# Patient Record
Sex: Female | Born: 1975 | Race: White | Hispanic: No | Marital: Married
Health system: Southern US, Community
[De-identification: ages and names within clinical notes are randomized; demographics above are authoritative.]

---

## 2014-08-06 ENCOUNTER — Ambulatory Visit: Payer: Self-pay | Admitting: Emergency Medicine

## 2014-08-06 LAB — WET PREP, GENITAL

## 2014-08-06 LAB — URINALYSIS, COMPLETE
Bilirubin,UR: NEGATIVE
Blood: NEGATIVE
Glucose,UR: NEGATIVE
KETONE: NEGATIVE
Nitrite: NEGATIVE
PH: 6.5 (ref 5.0–8.0)
PROTEIN: NEGATIVE
SPECIFIC GRAVITY: 1.015 (ref 1.000–1.030)

## 2014-08-06 LAB — GC/CHLAMYDIA PROBE AMP

## 2014-08-08 LAB — URINE CULTURE

## 2016-11-07 ENCOUNTER — Other Ambulatory Visit: Payer: Self-pay | Admitting: Obstetrics and Gynecology

## 2016-11-07 DIAGNOSIS — Z1231 Encounter for screening mammogram for malignant neoplasm of breast: Secondary | ICD-10-CM

## 2016-11-23 ENCOUNTER — Ambulatory Visit
Admission: RE | Admit: 2016-11-23 | Discharge: 2016-11-23 | Disposition: A | Discharge status: Home / Self Care | Payer: 59 | Source: Ambulatory Visit | Attending: Obstetrics and Gynecology | Admitting: Obstetrics and Gynecology

## 2016-11-23 DIAGNOSIS — Z1231 Encounter for screening mammogram for malignant neoplasm of breast: Secondary | ICD-10-CM | POA: Insufficient documentation

## 2017-06-29 ENCOUNTER — Other Ambulatory Visit: Payer: Self-pay | Admitting: Medical Oncology

## 2017-06-29 ENCOUNTER — Emergency Department: Payer: 59

## 2017-06-29 ENCOUNTER — Other Ambulatory Visit: Payer: Self-pay

## 2017-06-29 ENCOUNTER — Ambulatory Visit: Payer: 59

## 2017-06-29 ENCOUNTER — Encounter: Payer: Self-pay | Admitting: Radiology

## 2017-06-29 ENCOUNTER — Ambulatory Visit
Admission: RE | Admit: 2017-06-29 | Discharge: 2017-06-29 | Disposition: A | Discharge status: Home / Self Care | Payer: 59 | Source: Ambulatory Visit | Attending: Medical Oncology | Admitting: Medical Oncology

## 2017-06-29 ENCOUNTER — Emergency Department
Admission: EM | Admit: 2017-06-29 | Discharge: 2017-06-29 | Disposition: A | Discharge status: Home / Self Care | Payer: 59 | Attending: Emergency Medicine | Admitting: Emergency Medicine

## 2017-06-29 DIAGNOSIS — R1031 Right lower quadrant pain: Secondary | ICD-10-CM

## 2017-06-29 DIAGNOSIS — N83292 Other ovarian cyst, left side: Secondary | ICD-10-CM | POA: Diagnosis not present

## 2017-06-29 DIAGNOSIS — N83202 Unspecified ovarian cyst, left side: Secondary | ICD-10-CM

## 2017-06-29 LAB — URINALYSIS, COMPLETE (UACMP) WITH MICROSCOPIC
Bacteria, UA: NONE SEEN
Bilirubin Urine: NEGATIVE
Glucose, UA: NEGATIVE mg/dL
HGB URINE DIPSTICK: NEGATIVE
Ketones, ur: 20 mg/dL — AB
Leukocytes, UA: NEGATIVE
NITRITE: NEGATIVE
Protein, ur: NEGATIVE mg/dL
SPECIFIC GRAVITY, URINE: 1.018 (ref 1.005–1.030)
pH: 5 (ref 5.0–8.0)

## 2017-06-29 LAB — COMPREHENSIVE METABOLIC PANEL
ALBUMIN: 4.6 g/dL (ref 3.5–5.0)
ALK PHOS: 38 U/L (ref 38–126)
ALT: 21 U/L (ref 14–54)
AST: 19 U/L (ref 15–41)
Anion gap: 8 (ref 5–15)
BILIRUBIN TOTAL: 0.7 mg/dL (ref 0.3–1.2)
BUN: 11 mg/dL (ref 6–20)
CALCIUM: 9.1 mg/dL (ref 8.9–10.3)
CO2: 28 mmol/L (ref 22–32)
Chloride: 104 mmol/L (ref 101–111)
Creatinine, Ser: 0.69 mg/dL (ref 0.44–1.00)
GFR calc Af Amer: >60 mL/min (ref >60)
GFR calc non Af Amer: >60 mL/min (ref >60)
GLUCOSE: 94 mg/dL (ref 65–99)
Potassium: 3.5 mmol/L (ref 3.5–5.1)
Sodium: 140 mmol/L (ref 135–145)
TOTAL PROTEIN: 7.1 g/dL (ref 6.5–8.1)

## 2017-06-29 LAB — CBC
HEMATOCRIT: 35.3 % (ref 35.0–47.0)
Hemoglobin: 12.4 g/dL (ref 12.0–16.0)
MCH: 31.4 pg (ref 26.0–34.0)
MCHC: 35.1 g/dL (ref 32.0–36.0)
MCV: 89.3 fL (ref 80.0–100.0)
Platelets: 283 10*3/uL (ref 150–440)
RBC: 3.95 MIL/uL (ref 3.80–5.20)
RDW: 12.6 % (ref 11.5–14.5)
WBC: 8.1 10*3/uL (ref 3.6–11.0)

## 2017-06-29 LAB — LIPASE, BLOOD: Lipase: 31 U/L (ref 11–51)

## 2017-06-29 LAB — POCT PREGNANCY, URINE: Preg Test, Ur: NEGATIVE

## 2017-06-29 LAB — PREGNANCY, URINE: Preg Test, Ur: NEGATIVE

## 2017-06-29 MED ORDER — IOPAMIDOL (ISOVUE-300) INJECTION 61%
100.0000 mL | Freq: Once | INTRAVENOUS | Status: AC | PRN
  Administered 2017-06-29: 100 mL via INTRAVENOUS
  Filled 2017-06-29: qty 100

## 2017-06-29 MED ORDER — ONDANSETRON HCL 4 MG/2ML IJ SOLN
4.0000 mg | Freq: Once | INTRAMUSCULAR | Status: AC
  Administered 2017-06-29: 4 mg via INTRAVENOUS

## 2017-06-29 MED ORDER — ACETAMINOPHEN 500 MG PO TABS
1000.0000 mg | ORAL_TABLET | Freq: Once | ORAL | Status: AC
  Administered 2017-06-29: 1000 mg via ORAL
  Filled 2017-06-29: qty 2

## 2017-06-29 MED ORDER — ONDANSETRON HCL 4 MG/2ML IJ SOLN
INTRAMUSCULAR | Status: AC
  Filled 2017-06-29: qty 2

## 2017-06-29 NOTE — ED Provider Notes (Signed)
[facility] Emergency Department Provider Note  ____________________________________________  Time seen: Approximately 9:22 PM  I have reviewed the triage vital signs and the nursing notes.   HISTORY  Chief Complaint Abdominal Pain    HPI Cindy Ramos is a 42 y.o. female nonpregnant patient, status post cholecystectomy remotely, presenting with right lower quadrant pain for several weeks.  The patient reports that initially, she had intermittent pain in the right lower quadrant however over the past several weeks she is now having a constant sharp pain.  She has no associated nausea or vomiting, diarrhea, dysuria.  Her PMD has been evaluating her and she has had a pelvic ultrasound showing bilateral ovarian cysts.  The patient denies any change in her vaginal discharge or vaginal bleeding.  She has not tried anything for her pain.  History reviewed. No pertinent past medical history.  There are no active problems to display for this patient.   No past surgical history on file.    Allergies Flagyl [metronidazole]  No family history on file.  Social History Social History   Tobacco Use  . Smoking status: Not on file  Substance Use Topics  . Alcohol use: Not on file  . Drug use: Not on file    Review of Systems Constitutional: No fever/chills.  No lightheadedness or syncope. Eyes: No visual changes. ENT: No sore throat. No congestion or rhinorrhea. Cardiovascular: Denies chest pain. Denies palpitations. Respiratory: Denies shortness of breath.  No cough. Gastrointestinal: Positive right lower quadrant and right pelvic pain.  No nausea, no vomiting.  No diarrhea.  No constipation. Genitourinary: Negative for dysuria.  No urinary frequency.  No hematuria.  No change in vaginal discharge.  Known bilateral ovarian cyst. Musculoskeletal: Negative for back pain. Skin: Negative for rash. Neurological: Negative for headaches. No focal  numbness, tingling or weakness.     ____________________________________________   PHYSICAL EXAM:  VITAL SIGNS: ED Triage Vitals  Enc Vitals Group     BP 06/29/17 1716 140/80     Pulse Rate 06/29/17 1716 65     Resp 06/29/17 1716 18     Temp 06/29/17 1716 98.4 F (36.9 C)     Temp Source 06/29/17 1716 Oral     SpO2 06/29/17 1716 100 %     Weight --      Height --      Head Circumference --      Peak Flow --      Pain Score 06/29/17 1722 6     Pain Loc --      Pain Edu? --      Excl. in GC? --     Constitutional: Alert and oriented. Well appearing and in no acute distress. Answers questions appropriately. Eyes: Conjunctivae are normal.  EOMI. No scleral icterus. Head: Atraumatic. Nose: No congestion/rhinnorhea. Mouth/Throat: Mucous membranes are moist.  Neck: No stridor.  Supple.   Cardiovascular: Normal rate, regular rhythm. No murmurs, rubs or gallops.  Respiratory: Normal respiratory effort.  No accessory muscle use or retractions. Lungs CTAB.  No wheezes, rales or ronchi. Gastrointestinal: Soft, and nondistended.  Mild tenderness to palpation in the right lower quadrant.  Negative Murphy sign.  No guarding or rebound.  No peritoneal signs. Musculoskeletal: No LE edema. Neurologic:  A&Ox3.  Speech is clear.  Face and smile are symmetric.  EOMI.  Moves all extremities well. Skin:  Skin is warm, dry and intact. No rash noted. Psychiatric: Mood and affect are normal. Speech and behavior are  normal.  Normal judgement.  ____________________________________________   LABS (all labs ordered are listed, but only abnormal results are displayed)  Labs Reviewed  URINALYSIS, COMPLETE (UACMP) WITH MICROSCOPIC - Abnormal; Notable for the following components:      Result Value   Color, Urine YELLOW (*)    APPearance HAZY (*)    Ketones, ur 20 (*)    Squamous Epithelial / LPF 0-5 (*)    All other components within normal limits  LIPASE, BLOOD  COMPREHENSIVE METABOLIC PANEL   CBC  PREGNANCY, URINE  POC URINE PREG, ED  POCT PREGNANCY, URINE   ____________________________________________  EKG  Not indicated ____________________________________________  RADIOLOGY  Ct Abdomen Pelvis W Contrast  Result Date: 06/29/2017 CLINICAL DATA:  Abdominal distension. Right lower quadrant pain for several weeks. Patient had recent pelvic ultrasound at an outside institution demonstrating bilateral ovarian cysts. EXAM: CT ABDOMEN AND PELVIS WITH CONTRAST TECHNIQUE: Multidetector CT imaging of the abdomen and pelvis was performed using the standard protocol following bolus administration of intravenous contrast. CONTRAST:  ISOVUE-300 IOPAMIDOL (ISOVUE-300) INJECTION 61% COMPARISON:  None. FINDINGS: Lower chest: The lung bases are clear. Hepatobiliary: Enlarged liver spanning 21 cm cranial caudal. No focal lesion. Clips in the gallbladder fossa postcholecystectomy. No biliary dilatation. Pancreas: No ductal dilatation or inflammation. Spleen: Normal in size without focal abnormality. Adrenals/Urinary Tract: Normal adrenal glands. No hydronephrosis or perinephric edema. Homogeneous renal enhancement with symmetric excretion on delayed phase imaging. Bilateral renal cysts, largest on the left measuring 4.3 cm. Urinary bladder is physiologically distended without wall thickening. Stomach/Bowel: Normal appendix, for example image 59 series 2. No bowel wall thickening, inflammatory change or obstruction. Stomach is nondistended. Vascular/Lymphatic: No significant vascular findings are present. No enlarged abdominal or pelvic lymph nodes. Reproductive: Left ovarian cyst measures 2.8 cm. Right ovary is normal in size. Possible nabothian cysts in the cervix. Other: Minimal free fluid in the pelvis. No free air. No intra-abdominal abscess. Musculoskeletal: There are no acute or suspicious osseous abnormalities. IMPRESSION: 1. Normal appendix. 2. Left ovarian cyst measuring 2.8 cm. Per  technologist notes, this was recently evaluated with pelvic ultrasound at an outside institution. 3. Hepatomegaly. Electronically Signed   By: Rubye Oaks M.D.   On: 06/29/2017 22:10    ____________________________________________   PROCEDURES  Procedure(s) performed: None  Procedures  Critical Care performed: No ____________________________________________   INITIAL IMPRESSION / ASSESSMENT AND PLAN / ED COURSE  Pertinent labs & imaging results that were available during my care of the patient were reviewed by me and considered in my medical decision making (see chart for details).  42 y.o. female with several weeks of right lower quadrant abdominal pain without any other associated symptoms.  Overall, the patient is hemodynamically stable and afebrile.  She does have known ovarian cysts bilaterally, which may be causing her pain, but she is not giving a clinical history that would be suggestive of ovarian torsion.  Appendicitis is much less likely given the length of time the patient has been having symptoms.  Her bowel movements have been normal and of obstruction or diverticulitis is also less likely.  We will get a CT scan for 3D evaluation of the abdomen, and treat the patient for her symptoms that she has not tried any pain medications.   ----------------------------------------- 10:22 PM on 06/29/2017 -----------------------------------------  Patient is laboratory studies are reassuring.  She has a normal white blood cell count.  She does not have a urinary tract infection.  She is nonpregnant.  Her CT  scan does not show any acute process.  She does have a left ovarian cyst which measures 2.8 cm; this is not the area where she has tenderness on my examination.  At this time, the patient does not have any emergency condition that would require surgery or admission to the hospital.  I will plan to discharge her home with continued follow-up with her primary care physician.  I  discussed her results, follow-up plan and return precautions with the patient and her husband.  At this time the patient is safe for discharge. ____________________________________________  FINAL CLINICAL IMPRESSION(S) / ED DIAGNOSES  Final diagnoses:  Left ovarian cyst  Right lower quadrant abdominal pain         NEW MEDICATIONS STARTED DURING THIS VISIT:  New Prescriptions   No medications on file      Rockne MenghiniNorman, Anne-Caroline, MD 06/29/17 2222

## 2017-06-29 NOTE — Discharge Instructions (Signed)
You may continue to take Tylenol and Motrin for your pain.  Please return to the emergency department if you develop severe pain, lightheadedness or fainting, fever, or any other symptoms concerning to you.

## 2017-06-29 NOTE — ED Triage Notes (Signed)
Pt presents w/ c/o RLQ abdominal pain x 1-2 weeks per pt report. Pt states gradually worsening. Pt seen by PCP today and sent to have outpatient UC to r/o appendicitis. Wrong test ordered by PCP. Pt sent to ED for further evaluation for appendicitis. Pt has US w/i past week that showed bilateral ovarian cysts. Pt ambulatory, A & O x 4, in no acute distress.

## 2017-11-08 ENCOUNTER — Other Ambulatory Visit: Payer: Self-pay | Admitting: Obstetrics and Gynecology

## 2017-11-08 DIAGNOSIS — Z1231 Encounter for screening mammogram for malignant neoplasm of breast: Secondary | ICD-10-CM

## 2017-11-28 ENCOUNTER — Ambulatory Visit
Admission: RE | Admit: 2017-11-28 | Discharge: 2017-11-28 | Disposition: A | Discharge status: Home / Self Care | Payer: 59 | Source: Ambulatory Visit | Attending: Obstetrics and Gynecology | Admitting: Obstetrics and Gynecology

## 2017-11-28 DIAGNOSIS — Z1231 Encounter for screening mammogram for malignant neoplasm of breast: Secondary | ICD-10-CM | POA: Insufficient documentation

## 2017-11-30 ENCOUNTER — Other Ambulatory Visit: Payer: Self-pay | Admitting: Obstetrics and Gynecology

## 2017-11-30 DIAGNOSIS — R928 Other abnormal and inconclusive findings on diagnostic imaging of breast: Secondary | ICD-10-CM

## 2017-11-30 DIAGNOSIS — R921 Mammographic calcification found on diagnostic imaging of breast: Secondary | ICD-10-CM

## 2017-12-07 ENCOUNTER — Ambulatory Visit
Admission: RE | Admit: 2017-12-07 | Discharge: 2017-12-07 | Disposition: A | Discharge status: Home / Self Care | Payer: 59 | Source: Ambulatory Visit | Attending: Obstetrics and Gynecology | Admitting: Obstetrics and Gynecology

## 2017-12-07 DIAGNOSIS — R921 Mammographic calcification found on diagnostic imaging of breast: Secondary | ICD-10-CM | POA: Diagnosis present

## 2017-12-07 DIAGNOSIS — R928 Other abnormal and inconclusive findings on diagnostic imaging of breast: Secondary | ICD-10-CM | POA: Diagnosis present

## 2017-12-13 ENCOUNTER — Other Ambulatory Visit: Payer: Self-pay | Admitting: Obstetrics and Gynecology

## 2017-12-13 DIAGNOSIS — R921 Mammographic calcification found on diagnostic imaging of breast: Secondary | ICD-10-CM

## 2017-12-13 DIAGNOSIS — R928 Other abnormal and inconclusive findings on diagnostic imaging of breast: Secondary | ICD-10-CM

## 2017-12-24 ENCOUNTER — Ambulatory Visit
Admission: RE | Admit: 2017-12-24 | Discharge: 2017-12-24 | Disposition: A | Discharge status: Home / Self Care | Payer: 59 | Source: Ambulatory Visit | Attending: Obstetrics and Gynecology | Admitting: Obstetrics and Gynecology

## 2017-12-24 DIAGNOSIS — R921 Mammographic calcification found on diagnostic imaging of breast: Secondary | ICD-10-CM | POA: Insufficient documentation

## 2017-12-24 DIAGNOSIS — R928 Other abnormal and inconclusive findings on diagnostic imaging of breast: Secondary | ICD-10-CM | POA: Diagnosis present

## 2017-12-24 HISTORY — PX: BREAST BIOPSY: SHX20

## 2017-12-25 LAB — SURGICAL PATHOLOGY

## 2019-01-14 ENCOUNTER — Encounter: Payer: Self-pay | Admitting: Occupational Therapy

## 2019-01-14 ENCOUNTER — Other Ambulatory Visit: Payer: Self-pay

## 2019-01-14 ENCOUNTER — Ambulatory Visit: Payer: 59 | Attending: Orthopedic Surgery | Admitting: Occupational Therapy

## 2019-01-14 DIAGNOSIS — G5621 Lesion of ulnar nerve, right upper limb: Secondary | ICD-10-CM | POA: Insufficient documentation

## 2019-01-14 DIAGNOSIS — M6281 Muscle weakness (generalized): Secondary | ICD-10-CM | POA: Diagnosis present

## 2019-01-14 DIAGNOSIS — M79601 Pain in right arm: Secondary | ICD-10-CM | POA: Diagnosis present

## 2019-01-14 DIAGNOSIS — M25621 Stiffness of right elbow, not elsewhere classified: Secondary | ICD-10-CM | POA: Insufficient documentation

## 2019-01-14 NOTE — Therapy (Signed)
[facility] [address], [place], [address] Phone: [phone]   Fax:  [phone]  Occupational Therapy Evaluation  Patient Details  Name: Cindy Ramos MRN: [id number] Date of Birth: 07/12/1975 Referring Provider (OT): Reche Dixon   Encounter Date: 01/14/2019  OT End of Session - 01/14/19 1129    Visit Number  1    Number of Visits  10    Date for OT Re-Evaluation  02/18/19    OT Start Time  0950    OT Stop Time  1100    OT Time Calculation (min)  70 min    Activity Tolerance  Patient tolerated treatment well    Behavior During Therapy  [facility] for tasks assessed/performed       History reviewed. No pertinent past medical history.  Past Surgical History:  Procedure Laterality Date  . BREAST BIOPSY Left 12/24/2017   left breast stereo  x clip path pending    There were no vitals filed for this visit.  Subjective Assessment - 01/14/19 1113    Subjective   Since my fall my elbow just did not get better- and then after the MRI my shoulder and neck started bothering me - pain got worse and numbness in my pinkie and ring finger- pain is between 4-10/10    Pertinent History  Pt fell down few steps July 15th , was seen by ortho on 8/18  - had MRI that showed contusion of ulnar N , had 2 xray at urgent care, and ortho - showing no xray - pt refer to OT    Patient Stated Goals  I want thepain better in my R arm so I can use it to do my work, take care of the new litter of puppies, gardening , baking , and house work    Currently in Pain?  Yes    Pain Score  4    9-10/10 with use or ROM   Pain Location  Elbow   R shoulder and upper traps   Pain Orientation  Right    Pain Descriptors / Indicators  Aching;Numbness;Pins and needles;Throbbing;Tender    Pain Type  Acute pain    Pain Onset  More than a month ago    Pain Frequency  Constant    Aggravating Factors   using it , ROM , bumping , pulling ,  lifting        [facility] OT Assessment - 01/14/19 0001      Assessment   Medical Diagnosis  R ulnar N contusion    Referring Provider (OT)  Reche Dixon    Onset Date/Surgical Date  11/27/18    Hand Dominance  Right    Next MD Visit  --   Sept 10th      Home  Environment   Lives With  Family      Prior Function   Vocation  Full time employment    Leisure  Work in Psychologist, educational, gardening , baking , homeschool, litter of puppies       Edema   Edema  elbow R 27.2 cm , L 26 cm       AROM   Right Elbow Flexion  135   pain increase to 9/10 end   Right Elbow Extension  --   pain increase to 9/10 end    Right Wrist Extension  --   pull volar at end range   Right Wrist Flexion  --   pull  at end range   Right Wrist Radial Deviation  20 Degrees    Right Wrist Ulnar Deviation  30 Degrees      Right Hand AROM   R Thumb Opposition to Index  --   Opposition to [facility]     Contrast to elbow prior to review of HEP     Contrast to elbow 2-3 x day  AROM for elbow pain free range of motion - stay out of flexion more than 90 degrees  8 reps  Shoulder flexion - reaching over head pain free - 8 reps Snow angles - shoulder ABD 8 reps pain free  Back against wall   5 x day scapula squeezes   AROM for wrist in all planes pain free 2x day during elbow and shoulder ROM   2 x day - husband massage R upper traps  Cervical lateral flexion on L , 8 hold 5 sec Scalenes stretch on the L 8 hold 5 sec   Modifications of positioning of R arm - avoid cradling, flexion more than 90 degrees elbow during  - sitting , walking and sleeping  Light compression sleeve - tubigrip F to wear to tolerance                OT Education - 01/14/19 1128    Education Details  findings , homeprogram, modifications of positioning of R UE    Person(s) Educated  Patient    Methods  Explanation;Demonstration;Tactile cues;Verbal cues;Handout    Comprehension  Verbalized understanding;Returned demonstration        OT Short Term Goals - 01/14/19 1140      OT SHORT TERM GOAL #1   Title  Pt to be independent in HEP to decrease pain on PREE by more than 10 points    Baseline  no knowledge on HEP    Time  2    Period  Weeks    Status  New    Target Date  01/28/19      OT SHORT TERM GOAL #2   Title  AROM in R cervical , shoulder and wrist in all planes with out increase insymptoms    Baseline  decrease shoulder , cervical ROM with pull or pain - and wrist flexion , ext pain or pull end range    Time  3    Period  Weeks    Status  New    Target Date  02/04/19        OT Long Term Goals - 01/14/19 1142      OT LONG TERM GOAL #1   Title  R elbow AROM improve to [facility] with out increase in symptoms    Baseline  elbow flexion 135 and ext -12 to -25 with pain increase to 9/10    Time  5    Period  Weeks    Status  New    Target Date  02/18/19      OT LONG TERM GOAL #2   Title  Pain on PREE improve with more than 25 points    Baseline  pain on PREE at eval 44/50    Time  5    Period  Weeks    Status  New    Target Date  02/18/19      OT LONG TERM GOAL #3   Title  Function score on PREE improve wiht more than 20 points    Baseline  at eval PREE function score 50/50    Time  5  Period  Weeks    Status  New    Target Date  02/18/19            Plan - 01/14/19 1130    Clinical Impression Statement  Pt is about 7 wks out from a fall resulting in  R ulnar N contusion - xray showed no fracture - pt show increase pain at R elbow, pect and upper traps , numbness in 5th and 4th digits,  decrease AROM in R cervical, shoulder ,elbow - pain increase from 4-10/10 with AROM - pt ed on nerve healing, modifications of positioning of R UE when walking , sitting and sleeping to decrease compression on ulnar N - did provided pt with light compression sleeve to wear to tolerance - all imiting her functional use of R dominant hand and arm in ADL's and IADL's    OT Occupational Profile and History   Problem Focused Assessment - Including review of records relating to presenting problem    Occupational performance deficits (Please refer to evaluation for details):  ADL's;IADL's;Rest and Sleep;Work;Play;Leisure;Social Participation    Body Structure / Function / Physical Skills  ADL;Flexibility;ROM;UE functional use;Edema;Pain;Strength;IADL    Rehab Potential  Fair    Clinical Decision Making  Several treatment options, min-mod task modification necessary    Comorbidities Affecting Occupational Performance:  None    Modification or Assistance to Complete Evaluation   No modification of tasks or assist necessary to complete eval    OT Frequency  2x / week    OT Duration  --   5 wks   OT Treatment/Interventions  Self-care/ADL training;Iontophoresis;Therapeutic exercise;Splinting;Patient/family education;Contrast Bath;Paraffin;Ultrasound;Manual Therapy    Plan  assess progress with HEP and ionto with dexamethazone    OT Home Exercise Plan  see pt instruction    Consulted and Agree with Plan of Care  Patient       Patient will benefit from skilled therapeutic intervention in order to improve the following deficits and impairments:   Body Structure / Function / Physical Skills: ADL, Flexibility, ROM, UE functional use, Edema, Pain, Strength, IADL       Visit Diagnosis: Pain in right arm - Plan: Ot plan of care cert/re-cert  Stiffness of right elbow, not elsewhere classified - Plan: Ot plan of care cert/re-cert  Muscle weakness (generalized) - Plan: Ot plan of care cert/re-cert  Ulnar nerve compression, right - Plan: Ot plan of care cert/re-cert    Problem List There are no active problems to display for this patient.   Oletta Cohn OTR/L,CLT 01/14/2019, 11:47 AM  [facility] [address] [place], [address] Phone: [phone]   Fax:  [phone]  Name: CHARDONAY LONGFELLOW MRN:  [id number] Date of Birth: [birth year]

## 2019-01-14 NOTE — Patient Instructions (Signed)
Contrast to elbow 2-3 x day  AROM for elbow pain free range of motion - stay out of flexion more than 90 degrees  8 reps  Shoulder flexion - reaching over head pain free - 8 reps Snow angles - shoulder ABD 8 reps pain free  Back against wall   5 x day scapula squeezes   AROM for wrist in all planes pain free 2x day during elbow and shoulder ROM   2 x day - husband massage R upper traps  Cervical lateral flexion on L , 8 hold 5 sec Scalenes stretch on the L 8 hold 5 sec   Modifications of positioning of R arm - avoid cradling, flexion more than 90 degrees elbow during  - sitting , walking and sleeping  Light compression sleeve - tubigrip F to wear to tolerance

## 2019-01-15 ENCOUNTER — Ambulatory Visit: Payer: 59 | Admitting: Occupational Therapy

## 2019-01-15 DIAGNOSIS — M79601 Pain in right arm: Secondary | ICD-10-CM

## 2019-01-15 DIAGNOSIS — G5621 Lesion of ulnar nerve, right upper limb: Secondary | ICD-10-CM

## 2019-01-15 DIAGNOSIS — M6281 Muscle weakness (generalized): Secondary | ICD-10-CM

## 2019-01-15 NOTE — Therapy (Signed)
[facility] [address], [place], [address] Phone: [phone]   Fax:  [phone]  Occupational Therapy Treatment  Patient Details  Name: Cindy Ramos MRN: [id number] Date of Birth: 02-15-1976 Referring Provider (OT): Reche Dixon   Encounter Date: 01/15/2019  OT End of Session - 01/15/19 1633    Visit Number  2    Number of Visits  10    Date for OT Re-Evaluation  02/18/19       No past medical history on file.  Past Surgical History:  Procedure Laterality Date  . BREAST BIOPSY Left 12/24/2017   left breast stereo  x clip path pending    There were no vitals filed for this visit.  Subjective Assessment - 01/15/19 1631    Subjective   It was rough last night - after I seen you yesterday - but I tried to keep my elbow more straight - and not cradling it - the compression sleeve feels good-    Pertinent History  Pt fell down few steps July 15th , was seen by ortho on 8/18  - had MRI that showed contusion of ulnar N , had 2 xray at urgent care, and ortho - showing no xray - pt refer to OT    Patient Stated Goals  I want thepain better in my R arm so I can use it to do my work, take care of the new litter of puppies, gardening , baking , and house work    Currently in Pain?  Yes    Pain Score  7     Pain Location  Arm    Pain Orientation  Right    Pain Descriptors / Indicators  Aching    Pain Type  Acute pain    Pain Onset  More than a month ago         Pt come in this date with pain 8/10  This date ionto initiate with dexamethazone - med patch but only could tolerate 0.5 intensity - done this date only for 18 min   skin check done prior and end - no skin issues - but pain  Pt to focus on pain control this weekend - decrease pain and maintain ROM - can do shoulder flexion in supine or sliding on wall  And wrist AROM in all planes  And NO  pulling , lifting or pushing  Can get elbow  pad for protection- padding during day over cubital tunnel and night time on volar elbow    OT Education - 01/15/19 1632    Education Details  findings , homeprogram, modifications of positioning of R UE and to look into soft elbow splint    Person(s) Educated  Patient    Methods  Explanation;Demonstration;Tactile cues;Verbal cues;Handout    Comprehension  Verbalized understanding;Returned demonstration       OT Short Term Goals - 01/14/19 1140      OT SHORT TERM GOAL #1   Title  Pt to be independent in HEP to decrease pain on PREE by more than 10 points    Baseline  no knowledge on HEP    Time  2    Period  Weeks    Status  New    Target Date  01/28/19      OT SHORT TERM GOAL #2   Title  AROM in R cervical , shoulder and wrist in all planes with out increase insymptoms    Baseline  decrease shoulder , cervical ROM with pull or pain - and wrist flexion , ext pain or pull end range    Time  3    Period  Weeks    Status  New    Target Date  02/04/19        OT Long Term Goals - 01/14/19 1142      OT LONG TERM GOAL #1   Title  R elbow AROM improve to [facility] with out increase in symptoms    Baseline  elbow flexion 135 and ext -12 to -25 with pain increase to 9/10    Time  5    Period  Weeks    Status  New    Target Date  02/18/19      OT LONG TERM GOAL #2   Title  Pain on PREE improve with more than 25 points    Baseline  pain on PREE at eval 44/50    Time  5    Period  Weeks    Status  New    Target Date  02/18/19      OT LONG TERM GOAL #3   Title  Function score on PREE improve wiht more than 20 points    Baseline  at eval PREE function score 50/50    Time  5    Period  Weeks    Status  New    Target Date  02/18/19            Plan - 01/15/19 1633    Clinical Impression Statement  Pt is about 7 wks out from a fall resulting in  R ulnar N contusion - xray showed no fracture - pt show increase pain at R elbow, pect and upper traps , numbness in 5th and 4th  digits,  decrease AROM in R cervical, shoulder ,elbow - pain increase from 4-10/10 with AROM - pt ed on nerve healing, modifications of positioning of R UE when walking , sitting and sleeping to decrease compression on ulnar N - did provided pt with light compression sleeve to wear to tolerance - all imiting her functional use of R dominant hand and arm in ADL's and IADL's    OT Occupational Profile and History  Problem Focused Assessment - Including review of records relating to presenting problem    Occupational performance deficits (Please refer to evaluation for details):  ADL's;IADL's;Rest and Sleep;Work;Play;Leisure;Social Participation    Body Structure / Function / Physical Skills  ADL;Flexibility;ROM;UE functional use;Edema;Pain;Strength;IADL    Rehab Potential  Fair    Clinical Decision Making  Several treatment options, min-mod task modification necessary    Comorbidities Affecting Occupational Performance:  None    Modification or Assistance to Complete Evaluation   No modification of tasks or assist necessary to complete eval    OT Frequency  2x / week    OT Duration  --   5 wks   OT Treatment/Interventions  Self-care/ADL training;Iontophoresis;Therapeutic exercise;Splinting;Patient/family education;Contrast Bath;Paraffin;Ultrasound;Manual Therapy    Plan  assess progress with HEP and ionto with dexamethazone    OT Home Exercise Plan  see pt instruction    Consulted and Agree with Plan of Care  Patient       Patient will benefit from skilled therapeutic intervention in order to improve the following deficits and impairments:   Body Structure / Function / Physical Skills: ADL, Flexibility, ROM, UE functional use, Edema, Pain, Strength, IADL       Visit Diagnosis: Muscle weakness (generalized)  Ulnar  nerve compression, right  Pain in right arm    Problem List There are no active problems to display for this patient.   Oletta Cohn OTR/L,CLT 01/15/2019, 5:56  PM  [facility] [address], [place], [address] Phone: [phone]   Fax:  [phone]  Name: Cindy Ramos MRN: [id number] Date of Birth: Apr 28, 1976

## 2019-01-21 ENCOUNTER — Other Ambulatory Visit: Payer: Self-pay

## 2019-01-21 ENCOUNTER — Ambulatory Visit: Payer: 59 | Admitting: Occupational Therapy

## 2019-01-21 DIAGNOSIS — G5621 Lesion of ulnar nerve, right upper limb: Secondary | ICD-10-CM

## 2019-01-21 DIAGNOSIS — M25621 Stiffness of right elbow, not elsewhere classified: Secondary | ICD-10-CM

## 2019-01-21 DIAGNOSIS — M79601 Pain in right arm: Secondary | ICD-10-CM

## 2019-01-21 DIAGNOSIS — M6281 Muscle weakness (generalized): Secondary | ICD-10-CM

## 2019-01-21 NOTE — Patient Instructions (Signed)
Same HEP- decrease pain priority - contrast , elbow pad , positioning and pain free AROM

## 2019-01-21 NOTE — Therapy (Signed)
[place] [facility] [address] [place], [address] Phone: [phone]   Fax:  [phone]  Occupational Therapy Treatment  Patient Details  Name: Cindy Ramos MRN: [id number] Date of Birth: 11-07-1975 Referring Provider (OT): Dedra Skeens   Encounter Date: 01/21/2019  OT End of Session - 01/21/19 1018    Visit Number  3    Number of Visits  10    Date for OT Re-Evaluation  02/18/19    OT Start Time  0900    OT Stop Time  0952    OT Time Calculation (min)  52 min    Activity Tolerance  Patient tolerated treatment well    Behavior During Therapy  [facility] for tasks assessed/performed       No past medical history on file.  Past Surgical History:  Procedure Laterality Date  . BREAST BIOPSY Left 12/24/2017   left breast stereo  x clip path pending    There were no vitals filed for this visit.  Subjective Assessment - 01/21/19 1005    Subjective   I had rough night after I seen you last time but last 2-3 nights actually feeling better- pain about 2/10 - and now about 4/10  - trying to keep hand in pocket and not carry my hand or propping up - did get this elbow sleeve like you told me Sat or Sun - and it is helpoing    Pertinent History  Pt fell down few steps July 15th , was seen by ortho on 8/18  - had MRI that showed contusion of ulnar N , had 2 xray at urgent care, and ortho - showing no xray - pt refer to OT    Patient Stated Goals  I want thepain better in my R arm so I can use it to do my work, take care of the new litter of puppies, gardening , baking , and house work    Currently in Pain?  Yes    Pain Score  4     Pain Location  Elbow    Pain Orientation  Right    Pain Descriptors / Indicators  Aching;Tender    Pain Type  Acute pain    Pain Onset  More than a month ago    Pain Frequency  Constant       Assess pt 's elbow pad - over weekend she got it and wearing at night time pad on  Volar  elbow and during day over cubital tunnel - she had it more radial - review fit again -and she can snip the distal band if to tight   Review with pt shoulder pendulum and flexion , snow angels for ABD - with elbow extention and flexion secondary gain -  Do not have to push elbow flexion and ext separate  Done sup /pro, flexion , ext, RD, UD  10 reps pain free   Pins and needles better in 5th and 4th  Pt report improvement in able to support on belt of pants or pocket - not propping up and sleeping better   ADD of 5th 4/5     Heat for 5 min to R upper traps - done graston nr 4 tool sweeping - and lateral flexion stretch  And scapula squeezes   [employer] nr 2 done on volar and dorsal forearm - but no tightness          Done at end of session to decrease pain -  with doing some AROM and manual    OT Treatments/Exercises (OP) - 01/21/19 0001      RUE Contrast Bath   Time  9 minutes    Comments  --   End of session to decrease pain             OT Education - 01/21/19 1008    Education Details  HEP review and POC    Person(s) Educated  Patient    Methods  Explanation;Demonstration;Tactile cues;Verbal cues;Handout    Comprehension  Verbalized understanding;Returned demonstration       OT Short Term Goals - 01/14/19 1140      OT SHORT TERM GOAL #1   Title  Pt to be independent in HEP to decrease pain on PREE by more than 10 points    Baseline  no knowledge on HEP    Time  2    Period  Weeks    Status  New    Target Date  01/28/19      OT SHORT TERM GOAL #2   Title  AROM in R cervical , shoulder and wrist in all planes with out increase insymptoms    Baseline  decrease shoulder , cervical ROM with pull or pain - and wrist flexion , ext pain or pull end range    Time  3    Period  Weeks    Status  New    Target Date  02/04/19        OT Long Term Goals - 01/14/19 1142      OT LONG TERM GOAL #1   Title  [facility] out [facility] in  symptoms    Baseline  elbow flexion 135 and ext -12 to -25 with pain increase to 9/10    Time  5    Period  Weeks    Status  New    Target Date  02/18/19      OT LONG TERM GOAL #2   Title  Pain on PREE improve with more than 25 points    Baseline  pain on PREE at eval 44/50    Time  5    Period  Weeks    Status  New    Target Date  02/18/19      OT LONG TERM GOAL #3   Title  Function score on PREE improve wiht more than 20 points    Baseline  at eval PREE function score 50/50    Time  5    Period  Weeks    Status  New    Target Date  02/18/19            Plan - 01/21/19 1018    Clinical Impression Statement  Pt is 8 wks out from R ulnar N contusion from fall - pt's pain decrease since last week to 2/10 night - do increase during day -but with her elbow pad and contrast can decrese it - pt ed again on positioning and priority this week is decrease pain not ROM or using it    OT Occupational Profile and History  Problem Focused Assessment - Including review of records relating to presenting problem    Occupational performance deficits (Please refer to evaluation for details):  ADL's;IADL's;Rest and Sleep;Work;Play;Leisure;Social Participation    Body Structure / Function / Physical Skills  ADL;Flexibility;ROM;UE functional use;Edema;Pain;Strength;IADL    Rehab Potential  Fair    Clinical Decision Making  Several treatment options, min-mod task modification  necessary    Comorbidities Affecting Occupational Performance:  None    Modification or Assistance to Complete Evaluation   No modification of tasks or assist necessary to complete eval    OT Frequency  2x / week    OT Duration  4 weeks    OT Treatment/Interventions  Self-care/ADL training;Iontophoresis;Therapeutic exercise;Splinting;Patient/family education;Contrast Bath;Paraffin;Ultrasound;Manual Therapy    Plan  assess progress with HEP and ionto with dexamethazone    OT Home Exercise Plan  see pt instruction     Consulted and Agree with Plan of Care  Patient       Patient will benefit from skilled therapeutic intervention in order to improve the following deficits and impairments:   Body Structure / Function / Physical Skills: ADL, Flexibility, ROM, UE functional use, Edema, Pain, Strength, IADL       Visit Diagnosis: Ulnar nerve compression, right  Pain in right arm  Stiffness of right elbow, not elsewhere classified  Muscle weakness (generalized)    Problem List There are no active problems to display for this patient.   Rosalyn Gess OTR/L,CLT 01/21/2019, 10:22 AM  [place] [facility] [address], [place], [address] Phone: [phone]   Fax:  [phone]  Name: Cindy Ramos MRN: [id number] Date of Birth: 05-10-1976

## 2019-01-24 ENCOUNTER — Ambulatory Visit: Payer: 59 | Admitting: Occupational Therapy

## 2019-01-24 ENCOUNTER — Other Ambulatory Visit: Payer: Self-pay

## 2019-01-24 DIAGNOSIS — M79601 Pain in right arm: Secondary | ICD-10-CM | POA: Diagnosis not present

## 2019-01-24 DIAGNOSIS — M6281 Muscle weakness (generalized): Secondary | ICD-10-CM

## 2019-01-24 DIAGNOSIS — G5621 Lesion of ulnar nerve, right upper limb: Secondary | ICD-10-CM

## 2019-01-24 DIAGNOSIS — M25621 Stiffness of right elbow, not elsewhere classified: Secondary | ICD-10-CM

## 2019-01-24 NOTE — Therapy (Signed)
[facility] [address] [place], [address] Phone: [phone]   Fax:  [phone]  Occupational Therapy Treatment  Patient Details  Name: Cindy Ramos MRN: [id number] Date of Birth: 15-Dec-1975 Referring Provider (OT): Dedra Skeens   Encounter Date: 01/24/2019  OT End of Session - 01/24/19 0937    Visit Number  4    Number of Visits  10    Date for OT Re-Evaluation  02/18/19    OT Start Time  0830    OT Stop Time  0926    OT Time Calculation (min)  56 min    Activity Tolerance  Patient tolerated treatment well    Behavior During Therapy  [facility] for tasks assessed/performed       No past medical history on file.  Past Surgical History:  Procedure Laterality Date  . BREAST BIOPSY Left 12/24/2017   left breast stereo  x clip path pending    There were no vitals filed for this visit.  Subjective Assessment - 01/24/19 0934    Subjective   I did see Dedra Skeens - follow up in 3 wks again - doing better- I can tell if it getting better -but when I use it - pain increase again - like washing my hair and cutting peppers    Pertinent History  Pt fell down few steps July 15th , was seen by ortho on 8/18  - had MRI that showed contusion of ulnar N , had 2 xray at urgent care, and ortho - showing no xray - pt refer to OT    Patient Stated Goals  I want thepain better in my R arm so I can use it to do my work, take care of the new litter of puppies, gardening , baking , and house work    Currently in Pain?  Yes    Pain Score  4     Pain Location  Arm    Pain Orientation  Right    Pain Descriptors / Indicators  Aching;Tender;Numbness    Pain Type  Acute pain    Pain Onset  More than a month ago    Pain Frequency  Constant         [facility] OT Assessment - 01/24/19 0001      AROM   Right Elbow Flexion  --   110 to 120   Right Elbow Extension  --   -18 pain free- can do extention but increase pain          Assess pt 's elbow pad - she did snip  the distal band that was to  tight  And wearing night time over anterior elbow and daytime - over cubital tunnel    shoulder pendulum and flexion , snow angels for ABD - with elbow extention and flexion secondary gain -  Do not have to push elbow flexion and ext separate  Done sup /pro, flexion , ext, RD, UD  10 reps pain free   Pins and needles better in 5th and 4th    Pt report improvement in able to support on belt of pants or pocket - not propping up and sleeping better   ADD of 5th digit 4/5     Heat for 5 min to R upper traps - done graston nr 4 tool sweeping - and lateral flexion stretch  And scapula squeezes  US done at end of session   OT Treatments/Exercises (OP) - 01/24/19 0001      Ultrasound   Ultrasound Location  posterior and ant to cubital tunnel     Ultrasound Parameters  3.3MHZ, 20% , 0.8 intensity     Ultrasound Goals  Edema;Pain      RUE Contrast Bath   Time  9 minutes    Comments  --   after AROM to decrease pain             OT Education - 01/24/19 0937    Education Details  HEP review and ed on keep pain undre 5/10 this weekend    Person(s) Educated  Patient    Methods  Explanation;Demonstration;Tactile cues;Verbal cues;Handout    Comprehension  Verbalized understanding;Returned demonstration       OT Short Term Goals - 01/14/19 1140      OT SHORT TERM GOAL #1   Title  Pt to be independent in HEP to decrease pain on PREE by more than 10 points    Baseline  no knowledge on HEP    Time  2    Period  Weeks    Status  New    Target Date  01/28/19      OT SHORT TERM GOAL #2   Title  AROM in R cervical , shoulder and wrist in all planes with out increase insymptoms    Baseline  decrease shoulder , cervical ROM with pull or pain - and wrist flexion , ext pain or pull end range    Time  3    Period  Weeks    Status  New    Target Date  02/04/19         OT Long Term Goals - 01/14/19 1142      OT LONG TERM GOAL #1   Title  R elbow AROM improve to [facility] with out increase in symptoms    Baseline  elbow flexion 135 and ext -12 to -25 with pain increase to 9/10    Time  5    Period  Weeks    Status  New    Target Date  02/18/19      OT LONG TERM GOAL #2   Title  Pain on PREE improve with more than 25 points    Baseline  pain on PREE at eval 44/50    Time  5    Period  Weeks    Status  New    Target Date  02/18/19      OT LONG TERM GOAL #3   Title  Function score on PREE improve wiht more than 20 points    Baseline  at eval PREE function score 50/50    Time  5    Period  Weeks    Status  New    Target Date  02/18/19            Plan - 01/24/19 6967    Clinical Impression Statement  Pt is 8 1/2 wls out from R ulnar N contusion - pt very active lady -pain is decreasing - but reinforce to rest elbow this weekend while husband can help - and do not have pain go more than 4-5/10 during day    OT Occupational Profile and History  Problem Focused Assessment - Including review of records relating to presenting problem    Occupational performance deficits (Please refer to evaluation for details):  ADL's;IADL's;Rest and Sleep;Work;Play;Leisure;Social Participation    Body  Structure / Function / Physical Skills  ADL;Flexibility;ROM;UE functional use;Edema;Pain;Strength;IADL    Rehab Potential  Fair    Clinical Decision Making  Several treatment options, min-mod task modification necessary    Comorbidities Affecting Occupational Performance:  None    Modification or Assistance to Complete Evaluation   No modification of tasks or assist necessary to complete eval    OT Frequency  2x / week    OT Duration  4 weeks    OT Treatment/Interventions  Self-care/ADL training;Iontophoresis;Therapeutic exercise;Splinting;Patient/family education;Contrast Bath;Paraffin;Ultrasound;Manual Therapy    Plan  assess progress with HEP and ionto with  dexamethazone    OT Home Exercise Plan  see pt instruction    Consulted and Agree with Plan of Care  Patient       Patient will benefit from skilled therapeutic intervention in order to improve the following deficits and impairments:   Body Structure / Function / Physical Skills: ADL, Flexibility, ROM, UE functional use, Edema, Pain, Strength, IADL       Visit Diagnosis: Ulnar nerve compression, right  Pain in right arm  Stiffness of right elbow, not elsewhere classified  Muscle weakness (generalized)    Problem List There are no active problems to display for this patient.   Oletta CohnuPreez, Najmo Pardue OTR/L,CLT 01/24/2019, 9:40 AM  [place] [facility] [address] [place], [place], [address] Phone: [phone]   Fax:  [phone]  Name: Bennye AlmJennifer F Arnold-Ta MRN: [id number] Date of Birth: 06-05-1975

## 2019-01-24 NOTE — Patient Instructions (Signed)
Same - but do not let pain increase more than 5/10 during day  Modify act, rest , elbow pad, contrast - and what ever other alternative ointments she use

## 2019-01-28 ENCOUNTER — Ambulatory Visit: Payer: 59 | Admitting: Occupational Therapy

## 2019-01-28 ENCOUNTER — Other Ambulatory Visit: Payer: Self-pay

## 2019-01-28 DIAGNOSIS — M25621 Stiffness of right elbow, not elsewhere classified: Secondary | ICD-10-CM

## 2019-01-28 DIAGNOSIS — M79601 Pain in right arm: Secondary | ICD-10-CM

## 2019-01-28 DIAGNOSIS — G5621 Lesion of ulnar nerve, right upper limb: Secondary | ICD-10-CM

## 2019-01-28 DIAGNOSIS — M6281 Muscle weakness (generalized): Secondary | ICD-10-CM

## 2019-01-28 NOTE — Therapy (Signed)
[facility] [facility] [address] [place], [place], [address] Phone: [phone]   Fax:  [phone]  Occupational Therapy Treatment  Patient Details  Name: Cindy AlmJennifer F Ramos MRN: [id number] Date of Birth: [birth year] Referring Provider (OT): Dedra Skeensodd Mundy   Encounter Date: 01/28/2019  OT End of Session - 01/28/19 1646    Number of Visits  10    Date for OT Re-Evaluation  02/18/19    OT Start Time  1415    OT Stop Time  1514    OT Time Calculation (min)  59 min    Activity Tolerance  Patient tolerated treatment well    Behavior During Therapy  [facility]       No past medical history on file.  Past Surgical History:  Procedure Laterality Date  . BREAST BIOPSY Left 12/24/2017   left breast stereo  x clip path pending    There were no vitals filed for this visit.  Subjective Assessment - 01/28/19 1639    Subjective   I really worked hard this weekend on not doing to much - felt like at times if doing nothing it got better , but then like washing my hair, writing it makes it hurt again - and today was not good day - did not do any heat or ice yet - was with son at oral surgeon and [place]ing [place]    Pertinent History  Pt fell down few steps July 15th , was seen by ortho on 8/18  - had MRI that showed contusion of ulnar N , had 2 xray at urgent care, and ortho - showing no xray - pt refer to OT    Patient Stated Goals  I want thepain better in my R arm so I can use it to do my work, take care of the new litter of puppies, gardening , baking , and house work    Currently in Pain?  Yes    Pain Score  7     Pain Location  Elbow    Pain Orientation  Right    Pain Descriptors / Indicators  Aching;Numbness;Tender    Pain Type  Acute pain    Pain Onset  More than a month ago         Pt cont to wear elbow pad - she did snip  the distal band that was to  tight  And wearing  night time over anterior elbow and daytime - over cubital tunnel    shoulder pendulum and flexion , snow angels for ABD - with elbow extention and flexion secondary gain - full extention  Elbow flexion WFL - but increase numbness in 5th digit  Do not have to push elbow flexion and ext separate Done sup /pro, RD, UD  AROM and can cont  10 reps pain free   Pins and needles  5th and 4th not constant anymore  But pt arrive this date with increase pain at lateral epicondyle - tenderness and pain with middle finger and wrist extention resistance  After contrast - done  Criss cross massage to lateral epicondyle AROM stretch to elbow extensors - with elbow to side 5-8 reps  Ice massage to lateral epicondyle And can do at home   Fitted with wrist splint to rest extensors -and mostly when up and about    Pt report improvement in able to support on belt of pants or pocket - not propping  up and sleeping better             OT Treatments/Exercises (OP) - 01/28/19 0001      Cryotherapy   Number Minutes Cryotherapy  2 Minutes    Cryotherapy Location  --   lateral epicondyle      RUE Contrast Bath   Time  9 minutes    Comments  --    to decrease pain after AROM             OT Education - 01/28/19 1643    Education Details  Review HEP and lateral epicondyle HEP    Person(s) Educated  Patient    Methods  Explanation;Demonstration;Tactile cues;Verbal cues;Handout    Comprehension  Verbalized understanding;Returned demonstration       OT Short Term Goals - 01/14/19 1140      OT SHORT TERM GOAL #1   Title  Pt to be independent in HEP to decrease pain on PREE by more than 10 points    Baseline  no knowledge on HEP    Time  2    Period  Weeks    Status  New    Target Date  01/28/19      OT SHORT TERM GOAL #2   Title  AROM in R cervical , shoulder and wrist in all planes with out increase insymptoms    Baseline  decrease shoulder , cervical ROM with pull or pain  - and wrist flexion , ext pain or pull end range    Time  3    Period  Weeks    Status  New    Target Date  02/04/19        OT Long Term Goals - 01/14/19 1142      OT LONG TERM GOAL #1   Title  R elbow AROM improve to [facility] with out increase in symptoms    Baseline  elbow flexion 135 and ext -12 to -25 with pain increase to 9/10    Time  5    Period  Weeks    Status  New    Target Date  02/18/19      OT LONG TERM GOAL #2   Title  Pain on PREE improve with more than 25 points    Baseline  pain on PREE at eval 44/50    Time  5    Period  Weeks    Status  New    Target Date  02/18/19      OT LONG TERM GOAL #3   Title  Function score on PREE improve wiht more than 20 points    Baseline  at eval PREE function score 50/50    Time  5    Period  Weeks    Status  New    Target Date  02/18/19            Plan - 01/28/19 1647    Clinical Impression Statement  (S) Pt is about 9 wks out from R ulnar N contusion -pt show increase extention of elbow - end range of elbow flexion still pain and increase numbness in 5th- cannot do Ulnar N glide - attempted - pt this date with pain and tenderness at lateral epicondyle- pain with wrist and 3rd digit extention resistance -pt place in wrist splint and HEP add    OT Occupational Profile and History  Problem Focused Assessment - Including review of records relating to presenting problem    Occupational performance deficits (Please  refer to evaluation for details):  ADL's;IADL's;Rest and Sleep;Work;Play;Leisure;Social Participation    Body Structure / Function / Physical Skills  ADL;Flexibility;ROM;UE functional use;Edema;Pain;Strength;IADL    Rehab Potential  Fair    Clinical Decision Making  Several treatment options, min-mod task modification necessary    Comorbidities Affecting Occupational Performance:  None    Modification or Assistance to Complete Evaluation   No modification of tasks or assist necessary to complete eval    OT Frequency   2x / week    OT Duration  4 weeks    OT Treatment/Interventions  Self-care/ADL training;Iontophoresis;Therapeutic exercise;Splinting;Patient/family education;Contrast Bath;Paraffin;Ultrasound;Manual Therapy    Plan  assess progress with HEP and lateral epicondyle    OT Home Exercise Plan  see pt instruction    Consulted and Agree with Plan of Care  Patient       Patient will benefit from skilled therapeutic intervention in order to improve the following deficits and impairments:   Body Structure / Function / Physical Skills: ADL, Flexibility, ROM, UE functional use, Edema, Pain, Strength, IADL       Visit Diagnosis: Ulnar nerve compression, right  Pain in right arm  Stiffness of right elbow, not elsewhere classified  Muscle weakness (generalized)    Problem List There are no active problems to display for this patient.   Rosalyn Gess OTR/l,CLT 01/28/2019, 4:50 PM  [place] [facility] [address], [place], [address] Phone: [phone]   Fax:  [phone]  Name: Cindy Ramos MRN: [id number] Date of Birth: 10-13-1975

## 2019-01-28 NOTE — Patient Instructions (Signed)
Add this date some light desentitization to elbow - cubital tunnel - light massage and textures 3-4 x 10-20 sec   Lateral epicondyle - wear wrist splint most of the time when up and using her hand  Contrast  Criss cross massage to lateral epicondyle AROM stretch to elbow extensors - with elbow to side 5-8 reps  Ice massage to lateral epicondyle

## 2019-01-30 ENCOUNTER — Ambulatory Visit: Payer: 59 | Admitting: Occupational Therapy

## 2019-01-30 ENCOUNTER — Other Ambulatory Visit: Payer: Self-pay

## 2019-01-30 DIAGNOSIS — M79601 Pain in right arm: Secondary | ICD-10-CM

## 2019-01-30 DIAGNOSIS — G5621 Lesion of ulnar nerve, right upper limb: Secondary | ICD-10-CM

## 2019-01-30 DIAGNOSIS — M25621 Stiffness of right elbow, not elsewhere classified: Secondary | ICD-10-CM

## 2019-01-30 DIAGNOSIS — M6281 Muscle weakness (generalized): Secondary | ICD-10-CM

## 2019-01-30 NOTE — Patient Instructions (Signed)
Same

## 2019-01-30 NOTE — Therapy (Signed)
[place] [facility] [address] [place], [address] Phone: [phone]   Fax:  [phone]  Occupational Therapy Treatment  Patient Details  Name: LASAUNDRA RICHE MRN: [id number] Date of Birth: Oct 26, 1975 Referring Provider (OT): Reche Dixon   Encounter Date: 01/30/2019  OT End of Session - 01/30/19 0827    Visit Number  5    Number of Visits  10    Date for OT Re-Evaluation  02/18/19    OT Start Time  0816    OT Stop Time  0858    OT Time Calculation (min)  42 min    Activity Tolerance  Patient tolerated treatment well    Behavior During Therapy  [facility] for tasks assessed/performed       No past medical history on file.  Past Surgical History:  Procedure Laterality Date  . BREAST BIOPSY Left 12/24/2017   left breast stereo  x clip path pending    There were no vitals filed for this visit.  Subjective Assessment - 01/30/19 0823    Subjective   It is better but so slow - I try really hard not to use my arm - it is hard - [place] so [place] and then want to go back to work - 2 weeks left    Pertinent History  Pt fell down few steps July 15th , was seen by ortho on 8/18  - had MRI that showed contusion of ulnar N , had 2 xray at urgent care, and ortho - showing no xray - pt refer to OT    Patient Stated Goals  I want thepain better in my R arm so I can use it to do my work, take care of the new litter of puppies, gardening , baking , and house work    Currently in Pain?  Yes    Pain Score  2    at [facility]   Pain Location  Elbow    Pain Orientation  Right          Pt able to get full elbow extention during shoulder ABD -and flexion WFL - but pain with end range and increase numbness in 4th and 5th  Pt report upper traps and shoulder doing better  Pt was fitted with wrist splint last time for lateral epicondylitis symptoms - show decrease pain - but still tender  over lateral epicondyle and pain with resistance to 3rd digit extention   After contrast - done  Criss cross massage to lateral epicondyle Was able to do some gentle graston tool nr 2 sweeping over dorsal forearm  AROM stretch to elbow extensors - with elbow to side 5-8 reps  Ice massage to lateral epicondyle And can do at home   Cont with  wrist splint to rest extensors -and mostly when up and about  In combination with elbow pad                   OT Education - 01/30/19 1257    Education Details  Review HEP and lateral epicondyle HEP    Methods  Explanation;Demonstration;Tactile cues;Verbal cues;Handout    Comprehension  Verbalized understanding;Returned demonstration       OT Short Term Goals - 01/14/19 1140      OT SHORT TERM GOAL #1   Title  Pt to be independent in HEP to decrease pain on PREE by more than 10 points  Baseline  no knowledge on HEP    Time  2    Period  Weeks    Status  New    Target Date  01/28/19      OT SHORT TERM GOAL #2   Title  AROM in R cervical , shoulder and wrist in all planes with out increase insymptoms    Baseline  decrease shoulder , cervical ROM with pull or pain - and wrist flexion , ext pain or pull end range    Time  3    Period  Weeks    Status  New    Target Date  02/04/19        OT Long Term Goals - 01/14/19 1142      OT LONG TERM GOAL #1   Title  R elbow AROM improve to [employer] with out increase in symptoms    Baseline  elbow flexion 135 and ext -12 to -25 with pain increase to 9/10    Time  5    Period  Weeks    Status  New    Target Date  02/18/19      OT LONG TERM GOAL #2   Title  Pain on PREE improve with more than 25 points    Baseline  pain on PREE at eval 44/50    Time  5    Period  Weeks    Status  New    Target Date  02/18/19      OT LONG TERM GOAL #3   Title  Function score on PREE improve wiht more than 20 points    Baseline  at eval PREE function score 50/50    Time  5    Period  Weeks     Status  New    Target Date  02/18/19            Plan - 01/30/19 0827    Clinical Impression Statement  Pt is about 9 1/2 wks out from R ulnar N contusion - pt pain decrease to 2-5 /10 but can increase still end of day to 7/10 - reinforce again for pt to rest nerve -pt did show lateral epicondylitis symptoms this week - had to put pt on wrist splint - symptoms did decrease - cont to decrease pain without pt loosing motion or strength    OT Occupational Profile and History  Problem Focused Assessment - Including review of records relating to presenting problem    Occupational performance deficits (Please refer to evaluation for details):  ADL's;IADL's;Rest and Sleep;Work;Play;Leisure;Social Participation    Body Structure / Function / Physical Skills  ADL;Flexibility;ROM;UE functional use;Edema;Pain;Strength;IADL    Rehab Potential  Fair    Clinical Decision Making  Several treatment options, min-mod task modification necessary    Comorbidities Affecting Occupational Performance:  None    Modification or Assistance to Complete Evaluation   No modification of tasks or assist necessary to complete eval    OT Frequency  2x / week    OT Duration  4 weeks    OT Treatment/Interventions  Self-care/ADL training;Iontophoresis;Therapeutic exercise;Splinting;Patient/family education;Contrast Bath;Paraffin;Ultrasound;Manual Therapy    Plan  assess progress with HEP and lateral epicondyle    OT Home Exercise Plan  see pt instruction    Consulted and Agree with Plan of Care  Patient       Patient will benefit from skilled therapeutic intervention in order to improve the following deficits and impairments:   Body Structure / Function / Physical Skills: ADL,  Flexibility, ROM, UE functional use, Edema, Pain, Strength, IADL       Visit Diagnosis: Ulnar nerve compression, right  Pain in right arm  Stiffness of right elbow, not elsewhere classified  Muscle weakness (generalized)    Problem  List There are no active problems to display for this patient.   Oletta CohnuPreez, Danner Paulding  OTR/L,CLT 01/30/2019, 1:02 PM  [facility] [address] [place], [place], [address] Phone: [phone]   Fax:  [phone]  Name: Bennye AlmJennifer F Arnold-Furno MRN: [id number] Date of Birth: August 24, 1975

## 2019-02-03 ENCOUNTER — Encounter: Payer: 59 | Admitting: Occupational Therapy

## 2019-02-06 ENCOUNTER — Other Ambulatory Visit: Payer: Self-pay

## 2019-02-06 ENCOUNTER — Ambulatory Visit: Payer: 59 | Admitting: Occupational Therapy

## 2019-02-06 DIAGNOSIS — G5621 Lesion of ulnar nerve, right upper limb: Secondary | ICD-10-CM

## 2019-02-06 DIAGNOSIS — M25621 Stiffness of right elbow, not elsewhere classified: Secondary | ICD-10-CM

## 2019-02-06 DIAGNOSIS — M79601 Pain in right arm: Secondary | ICD-10-CM | POA: Diagnosis not present

## 2019-02-06 DIAGNOSIS — M6281 Muscle weakness (generalized): Secondary | ICD-10-CM

## 2019-02-06 NOTE — Therapy (Signed)
[facility] [address] [place], [address] Phone: [phone]   Fax:  [phone]  Occupational Therapy Treatment  Patient Details  Name: Cindy Ramos MRN: [id number] Date of Birth: Apr 13, 1976 Referring Provider (OT): Dedra Skeens   Encounter Date: 02/06/2019  OT End of Session - 02/06/19 1756    Visit Number  6    Number of Visits  10    Date for OT Re-Evaluation  02/18/19    OT Start Time  1031    OT Stop Time  1120    OT Time Calculation (min)  49 min    Activity Tolerance  Patient tolerated treatment well    Behavior During Therapy  [facility] for tasks assessed/performed       No past medical history on file.  Past Surgical History:  Procedure Laterality Date  . BREAST BIOPSY Left 12/24/2017   left breast stereo  x clip path pending    There were no vitals filed for this visit.  Subjective Assessment - 02/06/19 1035    Subjective   I feel like my arm is getting better - but it requires me to keep it still and not use it - pain do not increase more than 5/10 - I am on day 4 of prednisone    Pertinent History  Pt fell down few steps July 15th , was seen by ortho on 8/18  - had MRI that showed contusion of ulnar N , had 2 xray at urgent care, and ortho - showing no xray - pt refer to OT    Patient Stated Goals  I want thepain better in my R arm so I can use it to do my work, take care of the new litter of puppies, gardening , baking , and house work    Currently in Pain?  Yes    Pain Score  2     Pain Location  Elbow    Pain Orientation  Right    Pain Descriptors / Indicators  Aching    Pain Type  Acute pain    Pain Onset  More than a month ago    Pain Frequency  Constant       Pt AROM of elbow and shoulder WNL  wrist extention AROM in all planes WNL - but change forearm extensor stretch to AROM for wrist flexion with extended arm - for lateral epicondyle  Still tender over lat  epicondyle- but less tenderness over forearm Edema at elbow decreasing   Fabricated this date elbow custom extention splint at 100 degrees to immobilize elbow more during day   can take off for 30 min every 2 hrs - or change every 2 hrs to soft elbow pad during day                OT Treatments/Exercises (OP) - 02/06/19 0001      RUE Contrast Bath   Time  9 minutes    Comments  --   elbow and forearm prior to soft tissue      soft tissue mobs to forearm dorsal and volar - graston tool nr 2 sweeping        OT Education - 02/06/19 1756    Education Details  splint wearing    Person(s) Educated  Patient    Methods  Explanation;Demonstration;Tactile cues;Verbal cues;Handout    Comprehension  Verbalized understanding;Returned demonstration       OT Short Term Goals - 01/14/19 1140  OT SHORT TERM GOAL #1   Title  Pt to be independent in HEP to decrease pain on PREE by more than 10 points    Baseline  no knowledge on HEP    Time  2    Period  Weeks    Status  New    Target Date  01/28/19      OT SHORT TERM GOAL #2   Title  AROM in R cervical , shoulder and wrist in all planes with out increase insymptoms    Baseline  decrease shoulder , cervical ROM with pull or pain - and wrist flexion , ext pain or pull end range    Time  3    Period  Weeks    Status  New    Target Date  02/04/19        OT Long Term Goals - 01/14/19 1142      OT LONG TERM GOAL #1   Title  [facility] elbow [facility]OM [facility] to [facility] with out increase in symptoms    Baseline  elbow flexion 135 and ext -12 to -25 with pain increase to 9/10    Time  5    Period  Weeks    Status  New    Target Date  02/18/19      OT LONG TERM GOAL #2   Title  Pain on PREE improve with more than 25 points    Baseline  pain on PREE at eval 44/50    Time  5    Period  Weeks    Status  New    Target Date  02/18/19      OT LONG TERM GOAL #3   Title  Function score on PREE improve wiht more than 20 points     Baseline  at eval PREE function score 50/50    Time  5    Period  Weeks    Status  New    Target Date  02/18/19            Plan - 02/06/19 1757    Clinical Impression Statement  PT is 10 wks out from R ulnar N contusion - pt pain decrease to 2-5/10 - and pt try not to use hand - only spikes about 2 x during day - lateral epicondyle stil tender but forerm pain better and increase wrist and elbow AROM - but it do increase her symptoms if shedo AROM - fabricated this date custome elbow extention splint at 100 degrees to wear during day to immobilize ulnar N more while taking prednisone    OT Occupational Profile and History  Problem Focused Assessment - Including review of records relating to presenting problem    Occupational performance deficits (Please refer to evaluation for details):  ADL's;IADL's;Rest and Sleep;Work;Play;Leisure;Social Participation    Body Structure / Function / Physical Skills  ADL;Flexibility;ROM;UE functional use;Edema;Pain;Strength;IADL    Rehab Potential  Fair    Clinical Decision Making  Several treatment options, min-mod task modification necessary    Comorbidities Affecting Occupational Performance:  None    Modification or Assistance to Complete Evaluation   No modification of tasks or assist necessary to complete eval    OT Frequency  2x / week    OT Duration  4 weeks    OT Treatment/Interventions  Self-care/ADL training;Iontophoresis;Therapeutic exercise;Splinting;Patient/family education;Contrast Bath;Paraffin;Ultrasound;Manual Therapy    Plan  assess progress with pain and lateral epicondyle    OT Home Exercise Plan  see pt instruction  Consulted and Agree with Plan of Care  Patient       Patient will benefit from skilled therapeutic intervention in order to improve the following deficits and impairments:   Body Structure / Function / Physical Skills: ADL, Flexibility, ROM, UE functional use, Edema, Pain, Strength, IADL       Visit  Diagnosis: Ulnar nerve compression, right  Pain in right arm  Stiffness of right elbow, not elsewhere classified  Muscle weakness (generalized)    Problem List There are no active problems to display for this patient.   Rosalyn Gess OTR/L,CLT 02/06/2019, 6:00 PM  [place] [facility] [address], [place], [address] Phone: [phone]   Fax:  [phone]  Name: Cindy Ramos MRN: [id number] Date of Birth: [birth year]

## 2019-02-06 NOTE — Patient Instructions (Signed)
Wear hard elbow splint during day - can take off for 30 min every 2 hrs - or change every 2 hrs to soft elbow pad   Change elbow extensor stretch to extended elbow - AROM wrist flexion - in pronation and neutral -  5 reps

## 2019-02-18 ENCOUNTER — Other Ambulatory Visit: Payer: Self-pay

## 2019-02-18 ENCOUNTER — Ambulatory Visit: Payer: 59 | Attending: Orthopedic Surgery | Admitting: Occupational Therapy

## 2019-02-18 DIAGNOSIS — M6281 Muscle weakness (generalized): Secondary | ICD-10-CM | POA: Insufficient documentation

## 2019-02-18 DIAGNOSIS — M79601 Pain in right arm: Secondary | ICD-10-CM | POA: Insufficient documentation

## 2019-02-18 DIAGNOSIS — M25621 Stiffness of right elbow, not elsewhere classified: Secondary | ICD-10-CM

## 2019-02-18 DIAGNOSIS — G5621 Lesion of ulnar nerve, right upper limb: Secondary | ICD-10-CM

## 2019-02-18 NOTE — Therapy (Signed)
[facility] [address] [place], [place], [address] Phone: [phone]   Fax:  [phone]  Occupational Therapy Treatment  Patient Details  Name: Cindy Ramos MRN: [id number] Date of Birth: [birth year] Referring Provider (OT): Dedra Skeensodd Mundy   Encounter Date: 02/18/2019  OT End of Session - 02/18/19 1052    Visit Number  7    Number of Visits  10    Date for OT Re-Evaluation  02/18/19    OT Start Time  1051    OT Stop Time  1145    OT Time Calculation (min)  54 min    Activity Tolerance  Patient tolerated treatment well    Behavior During Therapy  [facility] assessed/performed       No past medical history on file.  Past Surgical History:  Procedure Laterality Date  . BREAST BIOPSY Left 12/24/2017   left breast stereo  x clip path pending    There were no vitals filed for this visit.  Subjective Assessment - 02/18/19 1254    Subjective   I tried to wear that hard splint for elbow - but then my shoulder starting hurting and upper traps -and it felt heavy - pain increase to 2-8 range again -so I stop - but now it is back to 2-5/10 - and can get it down to 2-3/10 yesterday    Pertinent History  Pt fell down few steps July 15th , was seen by ortho on 8/18  - had MRI that showed contusion of ulnar N , had 2 xray at urgent care, and ortho - showing no xray - pt refer to OT    Patient Stated Goals  I want thepain better in my R arm so I can use it to do my work, take care of the new litter of puppies, gardening , baking , and house work    Currently in Pain?  Yes    Pain Score  2     Pain Location  Arm    Pain Orientation  Right    Pain Descriptors / Indicators  Aching;Tender    Pain Type  Acute pain    Pain Onset  More than a month ago    Pain Frequency  Constant        assess AROM for L arm - elbow flexion increase to ear - with discomfort at end range -  extention WNL - with over head ABD  and flexion of shoulder   Pt report pain increase after trying to use custom elbow extention splint - up in shoulder - discontinued and used elbow sleeve with more success  Pain decrease again to 2-5/10 and the last 2 days to 2-3/10   Still tender over lateral epicondyle and pain with wrist extention  done some heat   cross friction massage and graston over dorsal and volar forearm - but no on ulnar side   And AROM for forearm extensors with elbow to side - in neutral and pronation position  5 reps  do not do extended arm   Done some graston after heat to upper traps with lateral cervical flexion to the L   Ionto with dexamethazone to lateral epicondyle on R- small patch -and 2.0 current  19 min  Tolerated well -one small blister - but no issues  Ed prior and afterwards and removed patch after wards  OT Education - 02/18/19 1255    Education Details  AROM and soft splint use HEP    Person(s) Educated  Patient    Methods  Explanation;Demonstration;Tactile cues;Verbal cues;Handout    Comprehension  Verbalized understanding;Returned demonstration       OT Short Term Goals - 01/14/19 1140      OT SHORT TERM GOAL #1   Title  Pt to be independent in HEP to decrease pain on PREE by more than 10 points    Baseline  no knowledge on HEP    Time  2    Period  Weeks    Status  New    Target Date  01/28/19      OT SHORT TERM GOAL #2   Title  AROM in R cervical , shoulder and wrist in all planes with out increase insymptoms    Baseline  decrease shoulder , cervical ROM with pull or pain - and wrist flexion , ext pain or pull end range    Time  3    Period  Weeks    Status  New    Target Date  02/04/19        OT Long Term Goals - 01/14/19 1142      OT LONG TERM GOAL #1   Title  R elbow AROM improve to [employer] [facility] with out increase in symptoms    Baseline  elbow flexion 135 and ext -12 to -25 with pain increase to 9/10    Time  5    Period  Weeks     Status  New    Target Date  02/18/19      OT LONG TERM GOAL #2   Title  Pain on PREE improve with more than 25 points    Baseline  pain on PREE at eval 44/50    Time  5    Period  Weeks    Status  New    Target Date  02/18/19      OT LONG TERM GOAL #3   Title  Function score on PREE improve wiht more than 20 points    Baseline  at eval PREE function score 50/50    Time  5    Period  Weeks    Status  New    Target Date  02/18/19            Plan - 02/18/19 1052    Clinical Impression Statement  Pt is 11 wks out from R ulnar N  contusion - pt pain decrease again to 2-5/10 the last few days- after it flared up using hard custom splint was made 2 wks ago - pt did had pain at the worse yesterday 3/10 - pt still tender over lateral epicondyle -and pain with wrist extention - cubital tunnel still tender - pt to do massage and soft texture over it -did try ionto wiht dexamethazone over lateral epicondyle this date - tolerate well    OT Occupational Profile and History  Problem Focused Assessment - Including review of records relating to presenting problem    Occupational performance deficits (Please refer to evaluation for details):  ADL's;IADL's;Rest and Sleep;Work;Play;Leisure;Social Participation    Body Structure / Function / Physical Skills  ADL;Flexibility;ROM;UE functional use;Edema;Pain;Strength;IADL    Rehab Potential  Fair    Clinical Decision Making  Several treatment options, min-mod task modification necessary    Comorbidities Affecting Occupational Performance:  None    Modification or Assistance to Complete Evaluation   No modification  of tasks or assist necessary to complete eval    OT Frequency  2x / week    OT Duration  4 weeks    OT Treatment/Interventions  Self-care/ADL training;Iontophoresis;Therapeutic exercise;Splinting;Patient/family education;Contrast Bath;Paraffin;Ultrasound;Manual Therapy    Plan  assess progress with pain and lateral epicondyle    OT Home  Exercise Plan  see pt instruction    Consulted and Agree with Plan of Care  Patient       Patient will benefit from skilled therapeutic intervention in order to improve the following deficits and impairments:   Body Structure / Function / Physical Skills: ADL, Flexibility, ROM, UE functional use, Edema, Pain, Strength, IADL       Visit Diagnosis: Ulnar nerve compression, right  Pain in right arm  Stiffness of right elbow, not elsewhere classified  Muscle weakness (generalized)    Problem List There are no active problems to display for this patient.   Oletta Cohn OTR/L,CLT 02/18/2019, 1:00 PM  [facility] SP[facility]TS [facility]IC[facility] [address] [place], [address] Phone: [phone]   Fax:  [phone]  Name: Cindy Ramos MRN: [id number] Date of Birth: 06-06-1975

## 2019-02-20 ENCOUNTER — Ambulatory Visit: Payer: 59 | Admitting: Occupational Therapy

## 2019-02-20 ENCOUNTER — Other Ambulatory Visit: Payer: Self-pay

## 2019-02-20 DIAGNOSIS — M6281 Muscle weakness (generalized): Secondary | ICD-10-CM

## 2019-02-20 DIAGNOSIS — M79601 Pain in right arm: Secondary | ICD-10-CM

## 2019-02-20 DIAGNOSIS — G5621 Lesion of ulnar nerve, right upper limb: Secondary | ICD-10-CM

## 2019-02-20 DIAGNOSIS — M25621 Stiffness of right elbow, not elsewhere classified: Secondary | ICD-10-CM

## 2019-02-20 NOTE — Patient Instructions (Signed)
Same - keep pain under 3/10

## 2019-02-20 NOTE — Therapy (Signed)
[place] [facility] [address], [place], [address] Phone: [phone]   Fax:  [phone]  Occupational Therapy Treatment  Patient Details  Name: Cindy Ramos MRN: [id number] Date of Birth: 09/26/1975 Referring Provider (OT): Dedra Skeens   Encounter Date: 02/20/2019  OT End of Session - 02/20/19 1205    Visit Number  8    Number of Visits  16    Date for OT Re-Evaluation  04/03/19    OT Start Time  1120    OT Stop Time  1220    OT Time Calculation (min)  60 min    Activity Tolerance  Patient tolerated treatment well    Behavior During Therapy  [facility] for tasks assessed/performed       No past medical history on file.  Past Surgical History:  Procedure Laterality Date  . BREAST BIOPSY Left 12/24/2017   left breast stereo  x clip path pending    There were no vitals filed for this visit.  Subjective Assessment - 02/20/19 1136    Subjective   It is better than it was last time - pain I am keeping under 2/10 - my fingers was swollen this am    Pertinent History  Pt fell down few steps July 15th , was seen by ortho on 8/18  - had MRI that showed contusion of ulnar N , had 2 xray at urgent care, and ortho - showing no xray - pt refer to OT    Patient Stated Goals  I want thepain better in my R arm so I can use it to do my work, take care of the new litter of puppies, gardening , baking , and house work    Currently in Pain?  Yes    Pain Score  2     Pain Location  Elbow    Pain Orientation  Right    Pain Descriptors / Indicators  Aching;Tender    Pain Type  Acute pain    Pain Onset  More than a month ago         assess AROM for L arm - elbow flexion increase to ear - with discomfort at end range -  extention WNL - with over head ABD and flexion of shoulder    Pain decrease again  This week to less than 3/10   Still tender over lateral epicondyle but less than earlier this week  and pain  with wrist extention still present  done some contrast   cross friction massage but did not tolerate graston over dorsal   but could do some on volar forearm  And AROM for forearm extensors with elbow to side - in neutral and pronation position  5 reps  do not do extended arm    skin check done prior - no issues Ionto with dexamethazone to lateral epicondyle on R- small patch -and 2.0 current  19 min  Tolerated well Ed prior and can keep patch on for hour                              OT Treatments/Exercises (OP) - 02/20/19 0001      Iontophoresis   Type of Iontophoresis  Dexamethasone    Location  lateral epicondyle    Dose  2 current , med patch       RUE Contrast Bath   Time  9 minutes  Comments  --   elbow and forearm             OT Education - 02/20/19 1204    Education Details  HEP    Person(s) Educated  Patient    Methods  Explanation;Demonstration;Tactile cues;Verbal cues;Handout    Comprehension  Verbalized understanding;Returned demonstration       OT Short Term Goals - 02/20/19 1247      OT SHORT TERM GOAL #1   Title  Pt to be independent in HEP to decrease pain on PREE by more than 10 points    Baseline  no knowledge on HEP - pain improve from 44/50 to 27/50    Status  Achieved      OT SHORT TERM GOAL #2   Title  AROM in R cervical , shoulder and wrist in all planes with out increase insymptoms    Baseline  cervical and shoulder good - but wrist extention pain because of lateral epincondyle    Time  3    Period  Weeks    Status  On-going    Target Date  03/13/19        OT Long Term Goals - 02/20/19 1248      OT LONG TERM GOAL #1   Title  R elbow [facility]OM [facility] with out increase in symptoms    Baseline  AROM WFL - with pain less than 3/10 - but repetitive - symptoms increase  pain decrease to 3/10 but lateral epicondyle still tender and pain with wrist extention    Time  6    Period  Weeks    Status   On-going    Target Date  04/03/19      OT LONG TERM GOAL #2   Title  Pain on PREE improve with more than 25 points    Baseline  pain on PREE at eval 44/50 and decrease to 27/50    Time  5    Period  Weeks    Status  On-going    Target Date  03/27/19      OT LONG TERM GOAL #3   Title  Function score on PREE improve wiht more than 20 points    Baseline  at eval PREE function score 50/50 adn improve to now 34/50    Time  5    Period  Weeks    Status  On-going    Target Date  03/27/19            Plan - 02/20/19 1208    Clinical Impression Statement  Pt is 11 1/2 wks out from R ulnar N contustion - pt also has lateral epincondylitis - pt report she is getting to keep her pain below 3/10 this week - responded great to ionto this week to latreal epicondyle - so pt can do Ulnar N glides    OT Occupational Profile and History  Problem Focused Assessment - Including review of records relating to presenting problem    Occupational performance deficits (Please refer to evaluation for details):  ADL's;IADL's;Rest and Sleep;Work;Play;Leisure;Social Participation    Body Structure / Function / Physical Skills  ADL;Flexibility;ROM;UE functional use;Edema;Pain;Strength;IADL    Rehab Potential  Fair    Clinical Decision Making  Several treatment options, min-mod task modification necessary    Comorbidities Affecting Occupational Performance:  None    Modification or Assistance to Complete Evaluation   No modification of tasks or assist necessary to complete eval    OT Frequency  2x /  week    OT Duration  4 weeks    OT Treatment/Interventions  Self-care/ADL training;Iontophoresis;Therapeutic exercise;Splinting;Patient/family education;Contrast Bath;Paraffin;Ultrasound;Manual Therapy    Plan  assess progress with pain and  ionto tolerance to lateral epicondyle    OT Home Exercise Plan  see pt instruction    Consulted and Agree with Plan of Care  Patient       Patient will benefit from skilled  therapeutic intervention in order to improve the following deficits and impairments:   Body Structure / Function / Physical Skills: ADL, Flexibility, ROM, UE functional use, Edema, Pain, Strength, IADL       Visit Diagnosis: Ulnar nerve compression, right - Plan: Ot plan of care cert/re-cert  Pain in right arm  Stiffness of right elbow, not elsewhere classified  Muscle weakness (generalized)    Problem List There are no active problems to display for this patient.   Rosalyn Gess OTR/L,CLT 02/20/2019, 12:57 PM  [place] [facility] [address], [place], [address] Phone: [phone]   Fax:  [phone]  Name: EMMELYN SCHMALE MRN: [id number] Date of Birth: [birth year]

## 2019-02-24 ENCOUNTER — Other Ambulatory Visit: Payer: Self-pay

## 2019-02-24 ENCOUNTER — Ambulatory Visit: Payer: 59 | Admitting: Occupational Therapy

## 2019-02-24 DIAGNOSIS — M6281 Muscle weakness (generalized): Secondary | ICD-10-CM

## 2019-02-24 DIAGNOSIS — M79601 Pain in right arm: Secondary | ICD-10-CM

## 2019-02-24 DIAGNOSIS — G5621 Lesion of ulnar nerve, right upper limb: Secondary | ICD-10-CM | POA: Diagnosis not present

## 2019-02-24 DIAGNOSIS — M25621 Stiffness of right elbow, not elsewhere classified: Secondary | ICD-10-CM

## 2019-02-24 NOTE — Therapy (Signed)
[place] [facility] [address] [place], [place], [address] Phone: [phone]   Fax:  [phone]  Occupational Therapy Treatment  Patient Details  Name: Cindy Ramos MRN: [id number] Date of Birth: 1975-06-20 Referring Provider (OT): Dedra Skeensodd Mundy   Encounter Date: 02/24/2019  OT End of Session - 02/24/19 0927    Visit Number  9    Number of Visits  16    Date for OT Re-Evaluation  04/03/19    OT Start Time  0841    Activity Tolerance  Patient tolerated treatment well    Behavior During Therapy  [facility] for tasks assessed/performed       No past medical history on file.  Past Surgical History:  Procedure Laterality Date  . BREAST BIOPSY Left 12/24/2017   left breast stereo  x clip path pending    There were no vitals filed for this visit.  Subjective Assessment - 02/24/19 0924    Subjective   I tried to keep my pain under 2-3/10 - increase 2 x but mostly when attempting some weight bearing or activity wtih resistance    Pertinent History  Pt fell down few steps July 15th , was seen by ortho on 8/18  - had MRI that showed contusion of ulnar N , had 2 xray at urgent care, and ortho - showing no xray - pt refer to OT    Patient Stated Goals  I want thepain better in my R arm so I can use it to do my work, take care of the new litter of puppies, gardening , baking , and house work    Currently in Pain?  Yes    Pain Score  2     Pain Location  Arm    Pain Orientation  Right    Pain Descriptors / Indicators  Aching;Tender    Pain Type  Acute pain    Pain Onset  More than a month ago        Assess AROM for elbow - WNL  Now - but cannot do any resistance - increase pain or repetition - add this date functional internal and external rotation - and able to go into flexion to lower back and behind head Add to HEP - but only 5 reps each - 2-3 x day   Show increase tolerance to rougher textures over medial elbow  - was able to tolerate only massage and soft textures up to last week   Tenderness over lateral epicondyle - improving with use of ionto -  Able to do elbow to side extensor stretches - with hand close   extended arm - need to have hand open - 5 reps  no ready for close hand     moist heat done to forearm and elbow - done some graston over volar forearm and soft issue over lateral epicondyle, webspace and MC spreads      skin check done prior to ionto - no issues   OT Treatments/Exercises (OP) - 02/24/19 0001      Iontophoresis   Type of Iontophoresis  Dexamethasone    Location  lateral epicondyle    Dose  1.8 current , med patch     Time  20       pt to keep patch on for hour after wards        OT Education - 02/24/19 0927    Education Details  HEP changes    Person(s) Educated  Patient    Methods  Explanation;Demonstration;Tactile cues;Verbal cues;Handout    Comprehension  Verbalized understanding;Returned demonstration       OT Short Term Goals - 02/20/19 1247      OT SHORT TERM GOAL #1   Title  Pt to be independent in HEP to decrease pain on PREE by more than 10 points    Baseline  no knowledge on HEP - pain improve from 44/50 to 27/50    Status  Achieved      OT SHORT TERM GOAL #2   Title  AROM in R cervical , shoulder and wrist in all planes with out increase insymptoms    Baseline  cervical and shoulder good - but wrist extention pain because of lateral epincondyle    Time  3    Period  Weeks    Status  On-going    Target Date  03/13/19        OT Long Term Goals - 02/20/19 1248      OT LONG TERM GOAL #1   Title  R elbow AROM improve to [facility] with out increase in symptoms    Baseline  AROM WFL - with pain less than 3/10 - but repetitive - symptoms increase  pain decrease to 3/10 but lateral epicondyle still tender and pain with wrist extention    Time  6    Period  Weeks    Status  On-going    Target Date  04/03/19      OT LONG TERM GOAL #2    Title  Pain on PREE improve with more than 25 points    Baseline  pain on PREE at eval 44/50 and decrease to 27/50    Time  5    Period  Weeks    Status  On-going    Target Date  03/27/19      OT LONG TERM GOAL #3   Title  Function score on PREE improve wiht more than 20 points    Baseline  at eval PREE function score 50/50 adn improve to now 34/50    Time  5    Period  Weeks    Status  On-going    Target Date  03/27/19            Plan - 02/24/19 4782    Clinical Impression Statement  Pt is 12 wks out from R ulnar N contusion - show increase tolerance this date on medial elbow massage, soft and roughter textures - able to add some functional flexion AROM HEP- lateral epicondyle pain improving with ionto - still tender over cubital tunnel    OT Occupational Profile and History  Problem Focused Assessment - Including review of records relating to presenting problem    Occupational performance deficits (Please refer to evaluation for details):  ADL's;IADL's;Rest and Sleep;Work;Play;Leisure;Social Participation    Body Structure / Function / Physical Skills  ADL;Flexibility;ROM;UE functional use;Edema;Pain;Strength;IADL    Rehab Potential  Fair    Clinical Decision Making  Several treatment options, min-mod task modification necessary    Comorbidities Affecting Occupational Performance:  None    Modification or Assistance to Complete Evaluation   No modification of tasks or assist necessary to complete eval    OT Frequency  2x / week    OT Duration  4 weeks    OT Treatment/Interventions  Self-care/ADL training;Iontophoresis;Therapeutic exercise;Splinting;Patient/family education;Contrast Bath;Paraffin;Ultrasound;Manual Therapy    Plan  assess progress with pain and  ionto tolerance to lateral epicondyle    OT  Home Exercise Plan  see pt instruction    Consulted and Agree with Plan of Care  Patient       Patient will benefit from skilled therapeutic intervention in order to improve  the following deficits and impairments:   Body Structure / Function / Physical Skills: ADL, Flexibility, ROM, UE functional use, Edema, Pain, Strength, IADL       Visit Diagnosis: Ulnar nerve compression, right  Pain in right arm  Stiffness of right elbow, not elsewhere classified  Muscle weakness (generalized)    Problem List There are no active problems to display for this patient.   Rosalyn Gess OTR/L,CLT 02/24/2019, 9:31 AM  [place] [facility] [address], [place], [address] Phone: [phone]   Fax:  [phone]  Name: Cindy Ramos MRN: [id number] Date of Birth: [birth year]

## 2019-02-24 NOTE — Patient Instructions (Signed)
Pt to add to shoulder ABD and flexion   some functional AROM for internal and external rotation behind head and lower back  5 reps  2-3 x day  For elbow - elbow to side - close hand - extensor stretches - 5 reps  And then extended elbow - but open hand - 5 reps in neutral and pronation position

## 2019-02-26 ENCOUNTER — Ambulatory Visit: Payer: 59 | Admitting: Occupational Therapy

## 2019-03-03 ENCOUNTER — Other Ambulatory Visit: Payer: Self-pay

## 2019-03-03 ENCOUNTER — Ambulatory Visit: Payer: 59 | Admitting: Occupational Therapy

## 2019-03-03 DIAGNOSIS — G5621 Lesion of ulnar nerve, right upper limb: Secondary | ICD-10-CM | POA: Diagnosis not present

## 2019-03-03 DIAGNOSIS — M25621 Stiffness of right elbow, not elsewhere classified: Secondary | ICD-10-CM

## 2019-03-03 DIAGNOSIS — M6281 Muscle weakness (generalized): Secondary | ICD-10-CM

## 2019-03-03 DIAGNOSIS — M79601 Pain in right arm: Secondary | ICD-10-CM

## 2019-03-03 NOTE — Patient Instructions (Signed)
Add partial Radial N glide  5 reps   2 x day  After forearm extensor stretches

## 2019-03-03 NOTE — Therapy (Signed)
[place] [facility] [address] [place], [address] Phone: [phone]   Fax:  [phone]  Occupational Therapy Treatment  Patient Details  Name: Cindy Ramos MRN: [id number] Date of Birth: 25-Feb-1976 Referring Provider (OT): Reche Dixon   Encounter Date: 03/03/2019  OT End of Session - 03/03/19 0841    Visit Number  10    Number of Visits  16    Date for OT Re-Evaluation  04/03/19    OT Start Time  0819    OT Stop Time  0905    OT Time Calculation (min)  46 min    Activity Tolerance  Patient tolerated treatment well    Behavior During Therapy  [facility] for tasks assessed/performed       No past medical history on file.  Past Surgical History:  Procedure Laterality Date  . BREAST BIOPSY Left 12/24/2017   left breast stereo  x clip path pending    There were no vitals filed for this visit.  Subjective Assessment - 03/03/19 0832    Subjective   I think I'm getting better - I can use it little more , bathing , dressing and some light activities and it do not bother me as much - like yesterday I did not feel it like usually    Pertinent History  Pt fell down few steps July 15th , was seen by ortho on 8/18  - had MRI that showed contusion of ulnar N , had 2 xray at urgent care, and ortho - showing no xray - pt refer to OT    Patient Stated Goals  I want thepain better in my R arm so I can use it to do my work, take care of the new litter of puppies, gardening , baking , and house work    Currently in Pain?  No/denies            Assess AROM for elbow - WNL  Now - but cannot do any resistance -less pain with using hand and arm in ADL's  - cont with functional internal and external rotation - and able to go into flexion to lower back and behind head    Show increase tolerance to rougher textures over medial elbow - was able to tolerate only massage and soft textures 2 wks ago  Tenderness over  lateral epicondyle - improving with use of ionto -  Able to do elbow to side extensor stretches - with hand close   extended arm - need to have hand open - 5 reps - both positions  also add radial N glides this date  But all only 5 reps      After contrast graston over volar forearm and soft issue over lateral epicondyle, webspace and MC spreads      skin check done prior to ionto - no issues         OT Treatments/Exercises (OP) - 03/03/19 0001      Iontophoresis   Type of Iontophoresis  Dexamethasone    Location  lateral epicondyle    Dose  1.8 current, med patch     Time  20      RUE Contrast Bath   Time  9 minutes    Comments  SOC to decrease pain prior to soft tissue              OT Education - 03/03/19 0841    Education Details  stretches and radial  N glide    Person(s) Educated  Patient    Methods  Explanation;Demonstration;Tactile cues;Verbal cues;Handout    Comprehension  Verbalized understanding;Returned demonstration       OT Short Term Goals - 02/20/19 1247      OT SHORT TERM GOAL #1   Title  Pt to be independent in HEP to decrease pain on PREE by more than 10 points    Baseline  no knowledge on HEP - pain improve from 44/50 to 27/50    Status  Achieved      OT SHORT TERM GOAL #2   Title  AROM in R cervical , shoulder and wrist in all planes with out increase insymptoms    Baseline  cervical and shoulder good - but wrist extention pain because of lateral epincondyle    Time  3    Period  Weeks    Status  On-going    Target Date  03/13/19        OT Long Term Goals - 02/20/19 1248      OT LONG TERM GOAL #1   Title  [facility] out increase in symptoms    Baseline  AROM WFL - with pain less than 3/10 - but repetitive - symptoms increase  pain decrease to 3/10 but lateral epicondyle still tender and pain with wrist extention    Time  6    Period  Weeks    Status  On-going    Target Date  04/03/19      OT LONG  TERM GOAL #2   Title  Pain on PREE improve with more than 25 points    Baseline  pain on PREE at eval 44/50 and decrease to 27/50    Time  5    Period  Weeks    Status  On-going    Target Date  03/27/19      OT LONG TERM GOAL #3   Title  Function score on PREE improve wiht more than 20 points    Baseline  at eval PREE function score 50/50 adn improve to now 34/50    Time  5    Period  Weeks    Status  On-going    Target Date  03/27/19            Plan - 03/03/19 0841    Clinical Impression Statement  Pt is 13 wks out from R ulnar N contusion - show decrease pain and able to do ADL's without increase symptoms - able to tolerate different textures over cubital tunnel - stil tender and pain over lateral epicondyle - cont with wrist splint and picking up wiht palm and hold off on still act that is resistance and increase symptoms    OT Occupational Profile and History  Problem Focused Assessment - Including review of records relating to presenting problem    Occupational performance deficits (Please refer to evaluation for details):  ADL's;IADL's;Rest and Sleep;Work;Play;Leisure;Social Participation    Body Structure / Function / Physical Skills  ADL;Flexibility;ROM;UE functional use;Edema;Pain;Strength;IADL    Rehab Potential  Good    Clinical Decision Making  Several treatment options, min-mod task modification necessary    Comorbidities Affecting Occupational Performance:  None    Modification or Assistance to Complete Evaluation   No modification of tasks or assist necessary to complete eval    OT Frequency  2x / week    OT Duration  4 weeks    OT Treatment/Interventions  Self-care/ADL training;Iontophoresis;Therapeutic  exercise;Splinting;Patient/family education;Contrast Bath;Paraffin;Ultrasound;Manual Therapy    Plan  assess progress with pain and  ionto tolerance to lateral epicondyle    OT Home Exercise Plan  see pt instruction    Consulted and Agree with Plan of Care  Patient        Patient will benefit from skilled therapeutic intervention in order to improve the following deficits and impairments:   Body Structure / Function / Physical Skills: ADL, Flexibility, ROM, UE functional use, Edema, Pain, Strength, IADL       Visit Diagnosis: Ulnar nerve compression, right  Pain in right arm  Stiffness of right elbow, not elsewhere classified  Muscle weakness (generalized)    Problem List There are no active problems to display for this patient.   Oletta Cohn OTR/L,CLT 03/03/2019, 12:16 PM  [place] [facility]IC[facility] [address], [place], [address] Phone: [phone]   Fax:  [phone]  Name: Cindy Ramos MRN: [id number] Date of Birth: [birth year]

## 2019-03-06 ENCOUNTER — Ambulatory Visit: Payer: 59 | Admitting: Occupational Therapy

## 2019-03-06 ENCOUNTER — Other Ambulatory Visit: Payer: Self-pay

## 2019-03-06 DIAGNOSIS — G5621 Lesion of ulnar nerve, right upper limb: Secondary | ICD-10-CM | POA: Diagnosis not present

## 2019-03-06 DIAGNOSIS — M79601 Pain in right arm: Secondary | ICD-10-CM

## 2019-03-06 DIAGNOSIS — M25621 Stiffness of right elbow, not elsewhere classified: Secondary | ICD-10-CM

## 2019-03-06 DIAGNOSIS — M6281 Muscle weakness (generalized): Secondary | ICD-10-CM

## 2019-03-06 NOTE — Therapy (Signed)
[facility] [address] [place], [address] Phone: [phone]   Fax:  [phone]  Occupational Therapy Treatment  Patient Details  Name: Cindy Ramos MRN: [id number] Date of Birth: 06/18/1975 Referring Provider (OT): Dedra Skeens   Encounter Date: 03/06/2019  OT End of Session - 03/06/19 1411    Visit Number  11    Number of Visits  16    Date for OT Re-Evaluation  04/03/19    OT Start Time  1304    OT Stop Time  1400    OT Time Calculation (min)  56 min    Activity Tolerance  Patient tolerated treatment well    Behavior During Therapy  [facility] for tasks assessed/performed       No past medical history on file.  Past Surgical History:  Procedure Laterality Date  . BREAST BIOPSY Left 12/24/2017   left breast stereo  x clip path pending    There were no vitals filed for this visit.  Subjective Assessment - 03/06/19 1312    Subjective   You told me to do little more last time - so I maybe over did it a little - so I could feel it more in my arm and shoulder - and has some knots back at my shoulder blade    Pertinent History  Pt fell down few steps July 15th , was seen by ortho on 8/18  - had MRI that showed contusion of ulnar N , had 2 xray at urgent care, and ortho - showing no xray - pt refer to OT    Patient Stated Goals  I want thepain better in my R arm so I can use it to do my work, take care of the new litter of puppies, gardening , baking , and house work    Currently in Pain?  Yes    Pain Score  2     Pain Location  Arm    Pain Orientation  Right    Pain Descriptors / Indicators  Aching;Tender    Pain Type  Acute pain    Pain Onset  More than a month ago       Assess AROM for elbow  WNL  - but bothered repetitive and  resistance -less pain with using hand and arm in ADL's  - cont with functional internal and external rotation - and able to go into flexion to lower back and  behind head with more ease  radial N glides but make sure shoulder is dropped But all only 5 reps  Tried composite Nerve glide out to side - but use counter force strap because of wrist extention at end range  5 reps   Show increase tolerance to rougher textures over medial elbow - was able to tolerate only massage and soft textures 2 wks ago  Tenderness over lateral epicondyle - improving with use of ionto -  Able to do elbow to side extensor stretches - with hand close  extended arm - need to have hand open - 5 reps - both positions  Review and ed pt on donning and wearing of counter force splint to decrease lateral epicondyle signs with use of hand   And she can try simulate some of her work act at home          After contrast graston over volar forearm and soft issue over lateral epicondyle, webspace and MC spreads      skin  check done prior to ionto - no issues       OT Treatments/Exercises (OP) - 03/06/19 0001      Iontophoresis   Type of Iontophoresis  Dexamethasone    Location  lateral epicondyle    Dose  2.0 current , med patch     Time  19      RUE Contrast Bath   Time  9 minutes    Comments  SOC at elbow to decrease pain and prior to stretches              OT Education - 03/06/19 1411    Education Details  stretches and radial  N glide / donning of counter force splint    Person(s) Educated  Patient    Methods  Explanation;Demonstration;Tactile cues;Verbal cues;Handout    Comprehension  Verbalized understanding;Returned demonstration       OT Short Term Goals - 02/20/19 1247      OT SHORT TERM GOAL #1   Title  Pt to be independent in HEP to decrease pain on PREE by more than 10 points    Baseline  no knowledge on HEP - pain improve from 44/50 to 27/50    Status  Achieved      OT SHORT TERM GOAL #2   Title  AROM in R cervical , shoulder and wrist in all planes with out increase insymptoms    Baseline  cervical and shoulder good  - but wrist extention pain because of lateral epincondyle    Time  3    Period  Weeks    Status  On-going    Target Date  03/13/19        OT Long Term Goals - 02/20/19 1248      OT LONG TERM GOAL #1   Title  R elbow AROM improve to [facility] with out increase in symptoms    Baseline  AROM WFL - with pain less than 3/10 - but repetitive - symptoms increase  pain decrease to 3/10 but lateral epicondyle still tender and pain with wrist extention    Time  6    Period  Weeks    Status  On-going    Target Date  04/03/19      OT LONG TERM GOAL #2   Title  Pain on PREE improve with more than 25 points    Baseline  pain on PREE at eval 44/50 and decrease to 27/50    Time  5    Period  Weeks    Status  On-going    Target Date  03/27/19      OT LONG TERM GOAL #3   Title  Function score on PREE improve wiht more than 20 points    Baseline  at eval PREE function score 50/50 adn improve to now 34/50    Time  5    Period  Weeks    Status  On-going    Target Date  03/27/19            Plan - 03/06/19 1411    Clinical Impression Statement  Ptt is 13 1/2 wks s/p R ulnar N contusion - pt show decrease ulnar N symptoms but still increase with resistance act and repetition - tender to rest elbow but improving - still tender over Lat epicondyle and resistance to 3rd digit extention painfull - pt ed on donning and wearing of counter force splint - and simulate some of her work activities at home prior to follow  up with MD next week    OT Occupational Profile and History  Problem Focused Assessment - Including review of records relating to presenting problem    Occupational performance deficits (Please refer to evaluation for details):  ADL's;IADL's;Rest and Sleep;Work;Play;Leisure;Social Participation    Body Structure / Function / Physical Skills  ADL;Flexibility;ROM;UE functional use;Edema;Pain;Strength;IADL    Rehab Potential  Good    Clinical Decision Making  Several treatment options, min-mod  task modification necessary    Comorbidities Affecting Occupational Performance:  None    Modification or Assistance to Complete Evaluation   No modification of tasks or assist necessary to complete eval    OT Frequency  2x / week    OT Duration  4 weeks    OT Treatment/Interventions  Self-care/ADL training;Iontophoresis;Therapeutic exercise;Splinting;Patient/family education;Contrast Bath;Paraffin;Ultrasound;Manual Therapy    Plan  assess progress with pain and  ionto tolerance to lateral epicondyle    OT Home Exercise Plan  see pt instruction    Consulted and Agree with Plan of Care  Patient       Patient will benefit from skilled therapeutic intervention in order to improve the following deficits and impairments:   Body Structure / Function / Physical Skills: ADL, Flexibility, ROM, UE functional use, Edema, Pain, Strength, IADL       Visit Diagnosis: Ulnar nerve compression, right  Pain in right arm  Stiffness of right elbow, not elsewhere classified  Muscle weakness (generalized)    Problem List There are no active problems to display for this patient.   Oletta CohnuPreez, Charna Neeb OTR/L,CLT 03/06/2019, 2:15 PM  [facility] [address] [place], [place], [address] Phone: [phone]   Fax:  [phone]  Name: Bennye AlmJennifer F Arnold-Hor MRN: [id number] Date of Birth: Oct 31, 1975

## 2019-03-06 NOTE — Patient Instructions (Signed)
Pt to use counter force splint to use during functional tasks - take turns with elbow pad  Ed on donning and wearing correctly

## 2019-03-07 ENCOUNTER — Encounter: Payer: 59 | Admitting: Occupational Therapy

## 2019-03-10 ENCOUNTER — Other Ambulatory Visit: Payer: Self-pay

## 2019-03-10 ENCOUNTER — Ambulatory Visit: Payer: 59 | Admitting: Occupational Therapy

## 2019-03-10 DIAGNOSIS — G5621 Lesion of ulnar nerve, right upper limb: Secondary | ICD-10-CM | POA: Diagnosis not present

## 2019-03-10 DIAGNOSIS — M6281 Muscle weakness (generalized): Secondary | ICD-10-CM

## 2019-03-10 DIAGNOSIS — M79601 Pain in right arm: Secondary | ICD-10-CM

## 2019-03-10 DIAGNOSIS — M25621 Stiffness of right elbow, not elsewhere classified: Secondary | ICD-10-CM

## 2019-03-10 NOTE — Therapy (Signed)
[facility] [address] [place], [place], [address] Phone: [phone]   Fax:  [phone]  Occupational Therapy Treatment  Patient Details  Name: Bennye AlmJennifer F Arnold-Schweiger MRN: [id number] Date of Birth: [birth year] Referring Provider (OT): Dedra Skeensodd Mundy   Encounter Date: 03/10/2019  OT End of Session - 03/10/19 0833    Visit Number  12    Number of Visits  16    Date for OT Re-Evaluation  04/03/19    OT Start Time  0815    OT Stop Time  0907    OT Time Calculation (min)  52 min    Activity Tolerance  Patient tolerated treatment well    Behavior During Therapy  [facility] for tasks assessed/performed       No past medical history on file.  Past Surgical History:  Procedure Laterality Date  . BREAST BIOPSY Left 12/24/2017   left breast stereo  x clip path pending    There were no vitals filed for this visit.  Subjective Assessment - 03/10/19 0832    Pertinent History  Pt fell down few steps July 15th , was seen by ortho on 8/18  - had MRI that showed contusion of ulnar N , had 2 xray at urgent care, and ortho - showing no xray - pt refer to OT    Patient Stated Goals  I want thepain better in my R arm so I can use it to do my work, take care of the new litter of puppies, gardening , baking , and house work    Currently in Pain?  Yes    Pain Score  4     Pain Location  Elbow    Pain Orientation  Right    Pain Descriptors / Indicators  Aching;Tender    Pain Type  Acute pain    Pain Onset  More than a month ago         [facility] - 03/10/19 0001      Strength   Right Hand Grip (lbs)  50    Left Hand Grip (lbs)  52               OT Treatments/Exercises (OP) - 03/10/19 0001      Moist Heat Therapy   Number Minutes Moist Heat  5 Minutes    Moist Heat Location  Elbow      Iontophoresis   Type of Iontophoresis  Dexamethasone    Location  lateral epicondyle    Dose  2.0 current , med  patch     Time  19       Assess AROM for elbow  WNL  -was able to work for about 4 hrs in yard - acheness in elbow 4/10 - stiffness this am 2/10  -less pain with using hand and arm in ADL's- cont withfunctional internal and external rotation - and able to go into flexion to lower back and behind head with more ease radial N glides but make sure shoulder is dropped and extensor stretches prior But all only 5 reps   Show increase tolerance to rougher textures over medial elbow - was able to tolerate only massage and soft textures3 wks ago  Tenderness over lateral epicondyle- little more this date after using it in yard yesterday  - need to pick up her counter force strap today  Was  improving with use of ionto -  Able to do elbow to side extensor stretches -  with hand close  could not do this date extended arm   Review and ed pt on donning and wearing of counter force splint to decrease lateral epicondyle signs with use of hand   And she can try simulate some of her work act at home and in yard for 4 hrs  Recommend going back to work 2-3 wks 4 hrs day - and increase tolerance gradually         OT Education - 03/10/19 0833    Education Details  stretches and radial  N glide / donning of counter force splint    Person(s) Educated  Patient    Methods  Explanation;Demonstration;Tactile cues;Verbal cues;Handout    Comprehension  Verbalized understanding;Returned demonstration       OT Short Term Goals - 02/20/19 1247      OT SHORT TERM GOAL #1   Title  Pt to be independent in HEP to decrease pain on PREE by more than 10 points    Baseline  no knowledge on HEP - pain improve from 44/50 to 27/50    Status  Achieved      OT SHORT TERM GOAL #2   Title  AROM in R cervical , shoulder and wrist in all planes with out increase insymptoms    Baseline  cervical and shoulder good - but wrist extention pain because of lateral epincondyle    Time  3    Period  Weeks    Status   On-going    Target Date  03/13/19        OT Long Term Goals - 02/20/19 1248      OT LONG TERM GOAL #1   Title  R elbow AROM improve to [facility] with out increase in symptoms    Baseline  AROM WFL - with pain less than 3/10 - but repetitive - symptoms increase  pain decrease to 3/10 but lateral epicondyle still tender and pain with wrist extention    Time  6    Period  Weeks    Status  On-going    Target Date  04/03/19      OT LONG TERM GOAL #2   Title  Pain on PREE improve with more than 25 points    Baseline  pain on PREE at eval 44/50 and decrease to 27/50    Time  5    Period  Weeks    Status  On-going    Target Date  03/27/19      OT LONG TERM GOAL #3   Title  Function score on PREE improve wiht more than 20 points    Baseline  at eval PREE function score 50/50 adn improve to now 34/50    Time  5    Period  Weeks    Status  On-going    Target Date  03/27/19            Plan - 03/10/19 1217    Clinical Impression Statement  Pt is 14 wks s/p R ulnar N contusion -she used her hand and arm more over weekend - about 4 hrs and were ache in the elbow 4/10 - decrease but increase stiffness in the am - appear that ulnar N injury improving but 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 - tender over lateral epicondyle and with resistance to 3rd digit extention - pain increaes - pt to get counter force strap to use and simulate 4 hrs of activity to return to work - would recommend for her to  return to work for 2-3 wks for 4 hrs before going back 8 hrs - making great progress    OT Occupational Profile and History  Problem Focused Assessment - Including review of records relating to presenting problem    Occupational performance deficits (Please refer to evaluation for details):  ADL's;IADL's;Rest and Sleep;Work;Play;Leisure;Social Participation    Body Structure / Function / Physical Skills  ADL;Flexibility;ROM;UE functional use;Edema;Pain;Strength;IADL    Rehab Potential  Good     Clinical Decision Making  Several treatment options, min-mod task modification necessary    Comorbidities Affecting Occupational Performance:  None    Modification or Assistance to Complete Evaluation   No modification of tasks or assist necessary to complete eval    OT Frequency  2x / week    OT Duration  4 weeks    OT Treatment/Interventions  Self-care/ADL training;Iontophoresis;Therapeutic exercise;Splinting;Patient/family education;Contrast Bath;Paraffin;Ultrasound;Manual Therapy    Plan  assess progress with pain and  ionto- increase use and counter force splint use for  lateral epicondyle    OT Home Exercise Plan  see pt instruction    Consulted and Agree with Plan of Care  Patient       Patient will benefit from skilled therapeutic intervention in order to improve the following deficits and impairments:   Body Structure / Function / Physical Skills: ADL, Flexibility, ROM, UE functional use, Edema, Pain, Strength, IADL       Visit Diagnosis: Pain in right arm  Stiffness of right elbow, not elsewhere classified  Muscle weakness (generalized)  Ulnar nerve compression, right    Problem List There are no active problems to display for this patient.   Rosalyn Gess OTR/L,CLT 03/10/2019, 12:36 PM  [place] PHYSICAL AND SPORTS MEDICINE [address] [place], [address] Phone: [phone]   Fax:  [phone]  Name: MELAT WRISLEY MRN: [id number] Date of Birth: Nov 08, 1975

## 2019-03-13 ENCOUNTER — Encounter: Payer: 59 | Admitting: Occupational Therapy

## 2019-03-14 ENCOUNTER — Ambulatory Visit: Payer: 59 | Admitting: Occupational Therapy

## 2019-03-18 ENCOUNTER — Other Ambulatory Visit: Payer: Self-pay

## 2019-03-18 ENCOUNTER — Ambulatory Visit: Payer: 59 | Attending: Orthopedic Surgery | Admitting: Occupational Therapy

## 2019-03-18 DIAGNOSIS — G5621 Lesion of ulnar nerve, right upper limb: Secondary | ICD-10-CM | POA: Insufficient documentation

## 2019-03-18 DIAGNOSIS — M6281 Muscle weakness (generalized): Secondary | ICD-10-CM | POA: Insufficient documentation

## 2019-03-18 DIAGNOSIS — M25621 Stiffness of right elbow, not elsewhere classified: Secondary | ICD-10-CM

## 2019-03-18 DIAGNOSIS — M79601 Pain in right arm: Secondary | ICD-10-CM | POA: Insufficient documentation

## 2019-03-18 NOTE — Therapy (Signed)
[place] [facility] SPORTS MEDICINE [address] [place], [address] Phone: [phone]   Fax:  [phone]  Occupational Therapy Treatment  Patient Details  Name: CHRISTI WIRICK MRN: [id number] Date of Birth: 1975/07/18 Referring Provider (OT): Reche Dixon   Encounter Date: 03/18/2019  OT End of Session - 03/18/19 0949    Visit Number  13    Number of Visits  16    Date for OT Re-Evaluation  04/03/19    OT Start Time  0833    OT Stop Time  0935    OT Time Calculation (min)  62 min    Activity Tolerance  Patient tolerated treatment well    Behavior During Therapy  [facility] for tasks assessed/performed       No past medical history on file.  Past Surgical History:  Procedure Laterality Date  . BREAST BIOPSY Left 12/24/2017   left breast stereo  x clip path pending    There were no vitals filed for this visit.  Subjective Assessment - 03/18/19 0933    Subjective   I am using it more and about 40% back to normal act - but vibration , pulling , pushing , and heavy objets stil pain full in elbow , posterior and over lateral epincondyle - I  did get strap but you need to show how to wear it - leaning on elbow still cannot    Pertinent History  Pt fell down few steps July 15th , was seen by ortho on 8/18  - had MRI that showed contusion of ulnar N , had 2 xray at urgent care, and ortho - showing no xray - pt refer to OT    Patient Stated Goals  I want thepain better in my R arm so I can use it to do my work, take care of the new litter of puppies, gardening , baking , and house work    Currently in Pain?  Yes    Pain Score  3     Pain Location  Elbow    Pain Orientation  Right    Pain Descriptors / Indicators  Aching;Tender    Pain Type  Acute pain    Pain Onset  More than a month ago         [facility] OT Assessment - 03/18/19 0001      AROM   Right Elbow Flexion  150    Right Elbow Extension  0      Strength   Right  Hand Grip (lbs)  65    Right Hand Lateral Pinch  18 lbs    Right Hand 3 Point Pinch  13 lbs    Left Hand Grip (lbs)  56    Left Hand Lateral Pinch  15 lbs    Left Hand 3 Point Pinch  16 lbs            Assess AROM for elbowWNL- about 40% back to using hand like prior - but pt very active - elbow pain gets to about 3/10 at the worse  - then she modify actvities  -less pain with using hand and arm in ADL's - like cooking try to do without any splints - cont withfunctional internal and external rotation - and able to go into flexion to lower back and behind headwith more ease radial N glidesbut make sure shoulder is dropped and extensor stretches prior  only 5 reps  Show increase tolerance to rougher textures over  medial elbow and light tapping this date  - vibration still bother her  Tenderness over lateral epicondyle- but decrease to 3/10   -  Able to do elbow to side extensor stretches - with hand close   and this date with  extended arm - ROM at wrist flexion about same as L   Review and ed pt on donning and wearing of counter force splint to decrease lateral epicondyle signs with use of hand  - simulate and pt brought 2 in - assess fit and what one better   A Hard time tolerate Ionto this date - had little scratch - had to move it - and lower current this date   OT Treatments/Exercises (OP) - 03/18/19 0001      Moist Heat Therapy   Number Minutes Moist Heat  5 Minutes    Moist Heat Location  Elbow      Iontophoresis   Type of Iontophoresis  Dexamethasone    Location  lateral epicondyle    Dose  1.5 current , med patch     Time  29       Pt out of work until appt with MD on 11th Nov       OT Education - 03/18/19 0949    Person(s) Educated  Patient    Methods  Explanation;Demonstration;Tactile cues;Verbal cues;Handout    Comprehension  Verbalized understanding;Returned demonstration       OT Short Term Goals - 02/20/19 1247      OT SHORT TERM  GOAL #1   Title  Pt to be independent in HEP to decrease pain on PREE by more than 10 points    Baseline  no knowledge on HEP - pain improve from 44/50 to 27/50    Status  Achieved      OT SHORT TERM GOAL #2   Title  AROM in R cervical , shoulder and wrist in all planes with out increase insymptoms    Baseline  cervical and shoulder good - but wrist extention pain because of lateral epincondyle    Time  3    Period  Weeks    Status  On-going    Target Date  03/13/19        OT Long Term Goals - 02/20/19 1248      OT LONG TERM GOAL #1   Title  R elbow AROM improve to [facility] with out increase in symptoms    Baseline  AROM WFL - with pain less than 3/10 - but repetitive - symptoms increase  pain decrease to 3/10 but lateral epicondyle still tender and pain with wrist extention    Time  6    Period  Weeks    Status  On-going    Target Date  04/03/19      OT LONG TERM GOAL #2   Title  Pain on PREE improve with more than 25 points    Baseline  pain on PREE at eval 44/50 and decrease to 27/50    Time  5    Period  Weeks    Status  On-going    Target Date  03/27/19      OT LONG TERM GOAL #3   Title  Function score on PREE improve wiht more than 20 points    Baseline  at eval PREE function score 50/50 adn improve to now 34/50    Time  5    Period  Weeks    Status  On-going    Target  Date  03/27/19            Plan - 03/18/19 0953    Clinical Impression Statement  Pt is 15 wks out from R ulnar N contusion - progressing very well in able to do more activities and starting to wean out of elbow pad - but need counter force strap for lateral epicondylitis - 3/10 pain this date - and feel about doing 40% back to normal activies - pt is very active 43 yrs old - grip strenght increase greatly - but vibration still , pulling ,lifting and repetition bother her    OT Occupational Profile and History  Problem Focused Assessment - Including review of records relating to presenting problem     Occupational performance deficits (Please refer to evaluation for details):  ADL's;IADL's;Rest and Sleep;Work;Play;Leisure;Social Participation    Body Structure / Function / Physical Skills  ADL;Flexibility;ROM;UE functional use;Edema;Pain;Strength;IADL    Rehab Potential  Good    Clinical Decision Making  Several treatment options, min-mod task modification necessary    Modification or Assistance to Complete Evaluation   No modification of tasks or assist necessary to complete eval    OT Frequency  2x / week    OT Duration  4 weeks    OT Treatment/Interventions  Self-care/ADL training;Iontophoresis;Therapeutic exercise;Splinting;Patient/family education;Contrast Bath;Paraffin;Ultrasound;Manual Therapy    Plan  assess progress with pain and  ionto- increase use and counter force splint use for  lateral epicondyle    OT Home Exercise Plan  see pt instruction    Consulted and Agree with Plan of Care  Patient       Patient will benefit from skilled therapeutic intervention in order to improve the following deficits and impairments:   Body Structure / Function / Physical Skills: ADL, Flexibility, ROM, UE functional use, Edema, Pain, Strength, IADL       Visit Diagnosis: Stiffness of right elbow, not elsewhere classified  Muscle weakness (generalized)  Ulnar nerve compression, right  Pain in right arm    Problem List There are no active problems to display for this patient.   Oletta CohnuPreez, Levorn Oleski OTR/L,CLT 03/18/2019, 10:09 AM  [facility] [address] [place], [place], [address] Phone: [phone]   Fax:  [phone]  Name: Bennye AlmJennifer F Arnold-Tanguma MRN: [id number] Date of Birth: 1975/08/02

## 2019-03-21 ENCOUNTER — Other Ambulatory Visit: Payer: Self-pay

## 2019-03-21 ENCOUNTER — Ambulatory Visit: Payer: 59 | Admitting: Occupational Therapy

## 2019-03-21 DIAGNOSIS — M79601 Pain in right arm: Secondary | ICD-10-CM

## 2019-03-21 DIAGNOSIS — M25621 Stiffness of right elbow, not elsewhere classified: Secondary | ICD-10-CM

## 2019-03-21 DIAGNOSIS — G5621 Lesion of ulnar nerve, right upper limb: Secondary | ICD-10-CM

## 2019-03-21 DIAGNOSIS — M6281 Muscle weakness (generalized): Secondary | ICD-10-CM

## 2019-03-21 NOTE — Therapy (Signed)
[facility] PHYSICAL AND SPORTS MEDICINE [address] [place], [place], [address] Phone: [phone]   Fax:  [phone]  Occupational Therapy Treatment  Patient Details  Name: Cindy AlmJennifer F Arnold-Kliebert MRN: [id number] Date of Birth: 11/16/1975 Referring Provider (OT): Dedra Skeensodd Mundy   Encounter Date: 03/21/2019  OT End of Session - 03/21/19 1146    Visit Number  14    Number of Visits  16    Date for OT Re-Evaluation  04/03/19    OT Start Time  0930    OT Stop Time  1025    OT Time Calculation (min)  55 min    Activity Tolerance  Patient tolerated treatment well    Behavior During Therapy  [facility] assessed/per[facility]       No past medical history on file.  Past Surgical History:  Procedure Laterality Date  . BREAST BIOPSY Left 12/24/2017   left breast stereo  x clip path pending    There were no vitals filed for this visit.  Subjective Assessment - 03/21/19 0942    Subjective   I done so much yesterday - dick some holes - about 9 and planted some trees and plants - pain increase last night and swelling to about 5/10 - but this am come down to about 4/10 - and cook some the day before    Pertinent History  Pt fell down few steps July 15th , was seen by ortho on 8/18  - had MRI that showed contusion of ulnar N , had 2 xray at urgent care, and ortho - showing no xray - pt refer to OT    Patient Stated Goals  I want thepain better in my R arm so I can use it to do my work, take care of the new litter of puppies, gardening , baking , and house work    Currently in Pain?  Yes    Pain Score  4     Pain Location  Elbow    Pain Orientation  Right    Pain Descriptors / Indicators  Aching;Tender    Pain Type  Acute pain        Pt arrive with pain in elbow 4/10 -and increase last night to 5/10   pt did cook heavy 2 days ago and pain did not increase more than 2/10  But yesterday she dig some holes and planted trees  Did had her elbow  counter force brace on  Pt ed on pain at rest to be 0-2/10  And increase with act to 3/10 -' but pain lingering more than 2 hrs or hurting more than that 12-24 hrs later - then she is not ready for that act  need to modify  She cannot do impact or vibration yet  today to spend time on decreasing her pain             OT Treatments/Exercises (OP) - 03/21/19 0001      Ultrasound   Ultrasound Location  lateral epicondyle and ext of forearm     Ultrasound Parameters  3.3MHZ 20 % , 1.0 intensity     Ultrasound Goals  Pain      RUE Contrast Bath   Time  9 minutes    Comments  prior to soft tissue        Tenderness over lateral epicondyle- elbow pain and swelling posterior elbow- about 4/10 today -  Able to do elbow to side extensor stretches - with hand  close   and this date with extended arm - ROM at wrist flexion about same as L  Sone some soft tissue mobs to lateral epicondyle and extensore  ice massage  Could not tolerate Ionto - had some tiny scabs from last time and elbow strap rubbing   done [place]         OT Education - 03/21/19 0946    Education Details  stretches and radial  N glide / donning of counter force splint- ice use or contrast when over doing things    Person(s) Educated  Patient    Methods  Explanation;Demonstration;Tactile cues;Verbal cues;Handout    Comprehension  Verbalized understanding;Returned demonstration       OT Short Term Goals - 02/20/19 1247      OT SHORT TERM GOAL #1   Title  Pt to be independent in HEP to decrease pain on PREE by more than 10 points    Baseline  no knowledge on HEP - pain improve from 44/50 to 27/50    Status  Achieved      OT SHORT TERM GOAL #2   Title  AROM in R cervical , shoulder and wrist in all planes with out increase insymptoms    Baseline  cervical and shoulder good - but wrist extention pain because of lateral epincondyle    Time  3    Period  Weeks    Status  On-going    Target Date  03/13/19         OT Long Term Goals - 02/20/19 1248      OT LONG TERM GOAL #1   Title  R elbow AROM improve to [facility] with out increase in symptoms    Baseline  AROM WFL - with pain less than 3/10 - but repetitive - symptoms increase  pain decrease to 3/10 but lateral epicondyle still tender and pain with wrist extention    Time  6    Period  Weeks    Status  On-going    Target Date  04/03/19      OT LONG TERM GOAL #2   Title  Pain on PREE improve with more than 25 points    Baseline  pain on PREE at eval 44/50 and decrease to 27/50    Time  5    Period  Weeks    Status  On-going    Target Date  03/27/19      OT LONG TERM GOAL #3   Title  Function score on PREE improve wiht more than 20 points    Baseline  at eval PREE function score 50/50 adn improve to now 34/50    Time  5    Period  Weeks    Status  On-going    Target Date  03/27/19            Plan - 03/21/19 1147    Clinical Impression Statement  Pt is about 15 wks out from R ulnar N contusion - was doing great - but then the last 2 days did heavy cooking but then dick holes and plant yesterday that increased pain more than 5/10 - pt to keep rest pain 0-2/10 and it should not increase more than 3-4/10 - not ready for vibration and impact - pt ed on increasing active in those guidelines    OT Occupational Profile and History  Problem Focused Assessment - Including review of records relating to presenting problem    Occupational performance deficits (Please refer to  evaluation for details):  ADL's;IADL's;Rest and Sleep;Work;Play;Leisure;Social Participation    Body Structure / Function / Physical Skills  ADL;Flexibility;ROM;UE functional use;Edema;Pain;Strength;IADL    Rehab Potential  Good    Clinical Decision Making  Several treatment options, min-mod task modification necessary    Comorbidities Affecting Occupational Performance:  None    Modification or Assistance to Complete Evaluation   No modification of tasks or assist  necessary to complete eval    OT Frequency  2x / week    OT Duration  4 weeks    OT Treatment/Interventions  Self-care/ADL training;Iontophoresis;Therapeutic exercise;Splinting;Patient/family education;Contrast Bath;Paraffin;Ultrasound;Manual Therapy    Plan  assess progress with pain and  ionto- increase use and counter force splint use for  lateral epicondyle    OT Home Exercise Plan  see pt instruction    Consulted and Agree with Plan of Care  Patient       Patient will benefit from skilled therapeutic intervention in order to improve the following deficits and impairments:   Body Structure / Function / Physical Skills: ADL, Flexibility, ROM, UE functional use, Edema, Pain, Strength, IADL       Visit Diagnosis: Stiffness of right elbow, not elsewhere classified  Muscle weakness (generalized)  Ulnar nerve compression, right  Pain in right arm    Problem List There are no active problems to display for this patient.   Oletta Cohn OTR/L,CLT 03/21/2019, 11:50 AM  [place] [facility] PHYSICAL AND SPORTS MEDICINE [address] [place], [address] Phone: [phone]   Fax:  [phone]  Name: DANNETTE KINKAID MRN: [id number] Date of Birth: [birth year]

## 2019-03-21 NOTE — Patient Instructions (Signed)
Ed pt on modifying act with pain

## 2019-03-25 ENCOUNTER — Ambulatory Visit: Payer: 59 | Admitting: Occupational Therapy

## 2019-03-25 ENCOUNTER — Other Ambulatory Visit: Payer: Self-pay

## 2019-03-25 DIAGNOSIS — M25621 Stiffness of right elbow, not elsewhere classified: Secondary | ICD-10-CM

## 2019-03-25 DIAGNOSIS — M6281 Muscle weakness (generalized): Secondary | ICD-10-CM

## 2019-03-25 DIAGNOSIS — G5621 Lesion of ulnar nerve, right upper limb: Secondary | ICD-10-CM

## 2019-03-25 DIAGNOSIS — M79601 Pain in right arm: Secondary | ICD-10-CM

## 2019-03-25 NOTE — Therapy (Signed)
[facility] PHYSICAL [facility] SPORTS MEDICINE [address], [place], [address] Phone: [phone]   Fax:  [phone]  Occupational Therapy Treatment  Patient Details  Name: Cindy Ramos MRN: [id number] Date of Birth: 12-29-1975 Referring Provider (OT): Dedra Skeens   Encounter Date: 03/25/2019  OT End of Session - 03/25/19 0940    Visit Number  15    Number of Visits  16    Date for OT Re-Evaluation  04/03/19    OT Start Time  0830    OT Stop Time  0928    OT Time Calculation (min)  58 min    Activity Tolerance  Patient tolerated treatment well    Behavior During Therapy  [facility] for tasks assessed/performed       No past medical history on file.  Past Surgical History:  Procedure Laterality Date  . BREAST BIOPSY Left 12/24/2017   left breast stereo  x clip path pending    There were no vitals filed for this visit.  Subjective Assessment - 03/25/19 0936    Subjective   I tried to get my pain down on Friday - from digging holes and planting trees - got it better by Sat - but then I over done things little again - pain about 3-4/10 -  swelling still in my elbow    Pertinent History  Pt fell down few steps July 15th , was seen by ortho on 8/18  - had MRI that showed contusion of ulnar N , had 2 xray at urgent care, and ortho - showing no xray - pt refer to OT    Patient Stated Goals  I want thepain better in my R arm so I can use it to do my work, take care of the new litter of puppies, gardening , baking , and house work    Currently in Pain?  Yes    Pain Score  4     Pain Location  Elbow    Pain Orientation  Right    Pain Descriptors / Indicators  Aching;Tender    Pain Type  Acute pain    Pain Onset  More than a month ago    Pain Frequency  Constant         Pt arrive with pain in elbow 3-4/10 still - did decrease it over Friday but then weekend it increased again    pt did cook heavy Last week o and pain did not  increase more than 2/10  But  The she dig some holes and planted trees the past weekend  Did had her elbow counter force brace on  Pt ed on pain at rest to be 0-2/10  And increase with act to 3/10 -' but pain lingering more than 2 hrs or hurting more than that 12-24 hrs later - then she is not ready for that act  need to modify  She cannot do impact or vibration yet - or a lot of resistance   today to spend time on decreasing her pain again and light activities             OT Treatments/Exercises (OP) - 03/25/19 0001      Iontophoresis   Type of Iontophoresis  Dexamethasone    Location  lateral epicondyle    Dose  2.0 current , med patch     Time  19      RUE Contrast Bath   Time  9 minutes  Comments  to decrease pain and edema        Tenderness over lateral epicondyle-elbow pain and swelling posterior elbow still - about 4/10 today - cannot tolerate her elbow braces - fitted with tubigrip F to sleep and wear as needed - with wrist splint  Able to do elbow to side extensor stretches - with hand close and this date withextended arm- open hand wrist extensor stretch   Sone some soft tissue mobs to lateral epicondyle and extensors, upper arm using graston tool nr 2 on upper arm - sweeping      was able to tolerate Ionto this date - but cut a small slit where there was still scab     OT Education - 03/25/19 0940    Education Details  stretches and radial  N glide / donning of counter force splint- ice use or contrast when over doing things    Person(s) Educated  Patient    Methods  Explanation;Demonstration;Tactile cues;Verbal cues;Handout    Comprehension  Verbalized understanding;Returned demonstration       OT Short Term Goals - 02/20/19 1247      OT SHORT TERM GOAL #1   Title  Pt to be independent in HEP to decrease pain on PREE by more than 10 points    Baseline  no knowledge on HEP - pain improve from 44/50 to 27/50    Status  Achieved      OT  SHORT TERM GOAL #2   Title  AROM in R cervical , shoulder and wrist in all planes with out increase insymptoms    Baseline  cervical and shoulder good - but wrist extention pain because of lateral epincondyle    Time  3    Period  Weeks    Status  On-going    Target Date  03/13/19        OT Long Term Goals - 02/20/19 1248      OT LONG TERM GOAL #1   Title  [facility] with out increase in symptoms    Baseline  AROM WFL - with pain less than 3/10 - but repetitive - symptoms increase  pain decrease to 3/10 but lateral epicondyle still tender and pain with wrist extention    Time  6    Period  Weeks    Status  On-going    Target Date  04/03/19      OT LONG TERM GOAL #2   Title  Pain on PREE improve with more than 25 points    Baseline  pain on PREE at eval 44/50 and decrease to 27/50    Time  5    Period  Weeks    Status  On-going    Target Date  03/27/19      OT LONG TERM GOAL #3   Title  Function score on PREE improve wiht more than 20 points    Baseline  at eval PREE function score 50/50 adn improve to now 34/50    Time  5    Period  Weeks    Status  On-going    Target Date  03/27/19            Plan - 03/25/19 0941    Clinical Impression Statement  Pt is about 15  1/2 wks out from R ulnar N contusion - was doing great - but then end of last week did heavy cooking but then dig holes and plant trees that increased pain  more than 5/10 - pt to keep rest pain 0-2/10 and it should not increase more than 3-4/10 - not ready for vibration and impact - pt ed on increasing activity but  in those guidelines    OT Occupational Profile and History  Problem Focused Assessment - Including review of records relating to presenting problem    Occupational performance deficits (Please refer to evaluation for details):  ADL's;IADL's;Rest and Sleep;Work;Play;Leisure;Social Participation    Body Structure / Function / Physical Skills  ADL;Flexibility;ROM;UE functional  use;Edema;Pain;Strength;IADL    Rehab Potential  Good    Clinical Decision Making  Several treatment options, min-mod task modification necessary    Comorbidities Affecting Occupational Performance:  None    Modification or Assistance to Complete Evaluation   No modification of tasks or assist necessary to complete eval    OT Frequency  2x / week    OT Duration  --   1 week   OT Treatment/Interventions  Self-care/ADL training;Iontophoresis;Therapeutic exercise;Splinting;Patient/family education;Contrast Bath;Paraffin;Ultrasound;Manual Therapy    Plan  assess progress with pain and  ionto- increase use and counter force splint use for  lateral epicondyle    OT Home Exercise Plan  see pt instruction    Consulted and Agree with Plan of Care  Patient       Patient will benefit from skilled therapeutic intervention in order to improve the following deficits and impairments:   Body Structure / Function / Physical Skills: ADL, Flexibility, ROM, UE functional use, Edema, Pain, Strength, IADL       Visit Diagnosis: Stiffness of right elbow, not elsewhere classified  Muscle weakness (generalized)  Ulnar nerve compression, right  Pain in right arm    Problem List There are no active problems to display for this patient.   Rosalyn Gess OTR/L,CT 03/25/2019, 9:44 AM  [place] PHYSICAL AND SPORTS MEDICINE [address], [place], [address] Phone: [phone]   Fax:  [phone]  Name: Cindy Ramos MRN: [id number] Date of Birth: 1975-12-25

## 2019-03-25 NOTE — Patient Instructions (Signed)
Pt to do contrast  Extensor stretch with close hand and elbow to side - 2 types and 5 reps each  And then extended arm - open hand - extensor stretch   5 reps each  Radial N glide 5 reps  And ulnar N glide 5 reps - but can support upper arm and do not add extention of wrist

## 2019-03-28 ENCOUNTER — Other Ambulatory Visit: Payer: Self-pay

## 2019-03-28 ENCOUNTER — Ambulatory Visit: Payer: 59 | Admitting: Occupational Therapy

## 2019-03-28 DIAGNOSIS — M25621 Stiffness of right elbow, not elsewhere classified: Secondary | ICD-10-CM | POA: Diagnosis not present

## 2019-03-28 DIAGNOSIS — G5621 Lesion of ulnar nerve, right upper limb: Secondary | ICD-10-CM

## 2019-03-28 DIAGNOSIS — M6281 Muscle weakness (generalized): Secondary | ICD-10-CM

## 2019-03-28 DIAGNOSIS — M79601 Pain in right arm: Secondary | ICD-10-CM

## 2019-03-28 NOTE — Patient Instructions (Signed)
Discuss some work modifications  And HEP at end of day if increase pain

## 2019-03-28 NOTE — Therapy (Signed)
[facility] PH[facility] MEDICINE [address] [place], [place], [address] Phone: [phone]   Fax:  [phone]  Occupational Therapy Treatment  Patient Details  Name: Cindy Ramos MRN: [id number] Date of Birth: 02/26/1976 Referring Provider (OT): Dedra Skeensodd Mundy   Encounter Date: 03/28/2019  OT End of Session - 03/28/19 1145    Visit Number  16    Number of Visits  16    Date for OT Re-Evaluation  04/03/19    OT Start Time  0938    OT Stop Time  1035    OT Time Calculation (min)  57 min    Activity Tolerance  Patient tolerated treatment well    Behavior During Therapy  [facility] for tasks assessed/performed       No past medical history on file.  Past Surgical History:  Procedure Laterality Date  . BREAST BIOPSY Left 12/24/2017   left breast stereo  x clip path pending    There were no vitals filed for this visit.  Subjective Assessment - 03/28/19 1127    Subjective   I am nervous about going back to work - but I am going to take both elbow pads- and did not overdo things the past 2 days    Pertinent History  Pt fell down few steps July 15th , was seen by ortho on 8/18  - had MRI that showed contusion of ulnar N , had 2 xray at urgent care, and ortho - showing no xray - pt refer to OT    Patient Stated Goals  I want thepain better in my R arm so I can use it to do my work, take care of the new litter of puppies, gardening , baking , and house work    Currently in Pain?  Yes    Pain Score  2     Pain Location  Elbow    Pain Orientation  Right    Pain Descriptors / Indicators  Aching    Pain Type  Acute pain    Pain Onset  More than a month ago         [facility] Assessment - 03/28/19 0001      Strength   Right Hand Grip (lbs)  66    Right Hand Lateral Pinch  19 lbs    Right Hand 3 Point Pinch  14 lbs    Left Hand Grip (lbs)  60    Left Hand Lateral Pinch  18 lbs    Left Hand 3 Point Pinch  17 lbs          Pt  arrive with pain in elbow  1-2/10  - did decrease since last week - she did some painting yesterday - but over head was hard because of arm /shoulder getting tired     She did had setback last weekend when she digsome holes and planted trees  Did had her elbow counter force brace on  Pt ed on pain at rest to be 0-2/10  And increase with act to 3/10 -' but pain lingering more than 2 hrs or hurting more than that 12-24 hrs later - then she is not ready for that act need to modify  She cannot do impact or vibration yet - or a lot of resistance  today to spend time on decreasing her pain again and light activities   Done over head AROM for shoulder flexion , ABD, functional internal and external rotation Ulnar ,  Radial N glides  And combination nerve glides  and stretches for lateral epicondyle - elbow to side and extended arm  Pain increase to about 4/10 - over extensor mass to lateral epicondyle        OT Treatments/Exercises (OP) - 03/28/19 0001      Iontophoresis   Type of Iontophoresis  Dexamethasone    Location  lateral epicondyle    Dose  2.0 current , med patch     Time  19      RUE Contrast Bath   Time  9 minutes    Comments  decrease pain prior to stretches and soft tissue         was able to tolerate Ionto this date - but cut a small slit where there was still 2 scabs          OT Education - 03/28/19 1145    Education Details  stretches and radial  N glide / donning of counter force splint- ice use or contrast when over doing things    Person(s) Educated  Patient    Methods  Explanation;Demonstration;Tactile cues;Verbal cues;Handout    Comprehension  Verbalized understanding;Returned demonstration       OT Short Term Goals - 02/20/19 1247      OT SHORT TERM GOAL #1   Title  Pt to be independent in HEP to decrease pain on PREE by more than 10 points    Baseline  no knowledge on HEP - pain improve from 44/50 to 27/50    Status  Achieved      OT  SHORT TERM GOAL #2   Title  AROM in R cervical , shoulder and wrist in all planes with out increase insymptoms    Baseline  cervical and shoulder good - but wrist extention pain because of lateral epincondyle    Time  3    Period  Weeks    Status  On-going    Target Date  03/13/19        OT Long Term Goals - 02/20/19 1248      OT LONG TERM GOAL #1   Title  R elbow AROM improve to [facility] with out increase in symptoms    Baseline  AROM WFL - with pain less than 3/10 - but repetitive - symptoms increase  pain decrease to 3/10 but lateral epicondyle still tender and pain with wrist extention    Time  6    Period  Weeks    Status  On-going    Target Date  04/03/19      OT LONG TERM GOAL #2   Title  Pain on PREE improve with more than 25 points    Baseline  pain on PREE at eval 44/50 and decrease to 27/50    Time  5    Period  Weeks    Status  On-going    Target Date  03/27/19      OT LONG TERM GOAL #3   Title  Function score on PREE improve wiht more than 20 points    Baseline  at eval PREE function score 50/50 adn improve to now 34/50    Time  5    Period  Weeks    Status  On-going    Target Date  03/27/19            Plan - 03/28/19 1146    Clinical Impression Statement  Pt is 16 wks out from R ulnar N contusion - but she  is more hindered by Lateral Epicondylitis symptoms - she done great but then flared up her arm after she dig some holes 2 wks ago - pain decrease from 1-4/10 the last 2 days - but still teneder on dorsal proximal forearm where she fell and lateral epicondyle tender - pt to return to work light duty next week - will check on pt in about week    OT Occupational Profile and History  Problem Focused Assessment - Including review of records relating to presenting problem    Occupational performance deficits (Please refer to evaluation for details):  ADL's;IADL's;Rest and Sleep;Work;Play;Leisure;Social Participation    Body Structure / Function / Physical Skills   ADL;Flexibility;ROM;UE functional use;Edema;Pain;Strength;IADL    Rehab Potential  Good    Clinical Decision Making  Several treatment options, min-mod task modification necessary    Comorbidities Affecting Occupational Performance:  None    Modification or Assistance to Complete Evaluation   No modification of tasks or assist necessary to complete eval    OT Frequency  1x / week    OT Duration  4 weeks    OT Treatment/Interventions  Self-care/ADL training;Iontophoresis;Therapeutic exercise;Splinting;Patient/family education;Contrast Bath;Paraffin;Ultrasound;Manual Therapy    Plan  assess progress with pain and  ionto- increase use and counter force splint use for  lateral epicondyle    OT Home Exercise Plan  see pt instruction    Consulted and Agree with Plan of Care  Patient       Patient will benefit from skilled therapeutic intervention in order to improve the following deficits and impairments:   Body Structure / Function / Physical Skills: ADL, Flexibility, ROM, UE functional use, Edema, Pain, Strength, IADL       Visit Diagnosis: Muscle weakness (generalized)  Ulnar nerve compression, right  Pain in right arm    Problem List There are no active problems to display for this patient.   Rosalyn Gess OTR/l,CLT 03/28/2019, 11:49 AM  [place] PHYSICAL AND SPORTS MEDICINE [address], [place], [address] Phone: [phone]   Fax:  [phone]  Name: AIMAN SONN MRN: [id number] Date of Birth: 09/13/1975

## 2019-04-03 ENCOUNTER — Ambulatory Visit: Payer: 59 | Admitting: Occupational Therapy

## 2019-04-03 ENCOUNTER — Other Ambulatory Visit: Payer: Self-pay

## 2019-04-03 DIAGNOSIS — G5621 Lesion of ulnar nerve, right upper limb: Secondary | ICD-10-CM

## 2019-04-03 DIAGNOSIS — M79601 Pain in right arm: Secondary | ICD-10-CM

## 2019-04-03 DIAGNOSIS — M25621 Stiffness of right elbow, not elsewhere classified: Secondary | ICD-10-CM

## 2019-04-03 DIAGNOSIS — M6281 Muscle weakness (generalized): Secondary | ICD-10-CM

## 2019-04-03 NOTE — Therapy (Signed)
[facility] [address] [place], [place], [address] Phone: [phone]   Fax:  [phone]  Occupational Therapy Treatment  Patient Details  Name: Cindy Ramos MRN: [id number] Date of Birth: 15-Dec-1975 Referring Provider (OT): Dedra Skeensodd Mundy   Encounter Date: 04/03/2019  OT End of Session - 04/03/19 1641    Visit Number  17    Number of Visits  22    Date for OT Re-Evaluation  05/15/19    OT Start Time  1518    OT Stop Time  1623    OT Time Calculation (min)  65 min    Activity Tolerance  Patient tolerated treatment well    Behavior During Therapy  [facility] tasks assessed/performed       No past medical history on file.  Past Surgical History:  Procedure Laterality Date  . BREAST BIOPSY Left 12/24/2017   left breast stereo  x clip path pending    There were no vitals filed for this visit.  Subjective Assessment - 04/03/19 1639    Subjective   It has been rough getting back to work. Monday and Tuesday was okay - but yesterday pain started - and throbbing - and today too - the whole elbow , sides and back    Pertinent History  Pt fell down few steps July 15th , was seen by ortho on 8/18  - had MRI that showed contusion of ulnar N , had 2 xray at urgent care, and ortho - showing no xray - pt refer to OT    Patient Stated Goals  I want thepain better in my R arm so I can use it to do my work, take care of the new litter of puppies, gardening , baking , and house work    Currently in Pain?  Yes    Pain Score  4     Pain Location  Elbow    Pain Orientation  Right    Pain Descriptors / Indicators  Aching;Tender;Throbbing    Pain Type  Acute pain    Pain Onset  More than a month ago       Pt tender and pain over R elbow - since going back to work this week - pain increase gradually to last night - 3rd day pain was more than other nights   Unable to do anything else at home after work  Pain today  4/10 over lateral, medial , and posterior elbow  Tender over Lateral epicondyle   AROM WNL - but pain 4/10              OT Treatments/Exercises (OP) - 04/03/19 0001      Iontophoresis   Type of Iontophoresis  Dexamethasone    Location  lateral epicondyle    Dose  2.0 current  but kicked off several times - and restart , med patch     Time  19      RUE Contrast Bath   Time  9 minutes    Comments  decrease pain and edema        Tenderness over lateral epicondyle- Able to do elbow to side extensor stretches - with hand closeand open  to hold off on extended arm if pain is more than 2/10 end of day  Soft tissue mobs to lateral epicondyle and extensors- using Graston and manual by OT   Pt priority as she is returning to work - is keep pain under 2/10 -  use heat, ice , massage and stretches   modify how she pick up and carry  And use elbow braces as needed          OT Education - 04/03/19 1641    Education Details  stretches and radial  N glide / donning of counter force splint- ice use or contrast when over doing things    Person(s) Educated  Patient    Methods  Explanation;Demonstration;Tactile cues;Verbal cues;Handout    Comprehension  Verbalized understanding;Returned demonstration       OT Short Term Goals - 04/03/19 1644      OT SHORT TERM GOAL #1   Title  Pt to be independent in HEP to decrease pain on PREE by more than 10 points    Baseline  no knowledge on HEP - pain improve from 44/50 to 27/50    Status  Achieved      OT SHORT TERM GOAL #2   Title  AROM in R cervical , shoulder and wrist in all planes with out increase insymptoms    Baseline  cervical and shoulder good - but wrist extention pain because of lateral epincondyle    Time  4    Period  Weeks    Status  On-going    Target Date  05/01/19        OT Long Term Goals - 04/03/19 1645      OT LONG TERM GOAL #1   Title  R elbow AROM improve to [facility] with out increase in symptoms     Baseline  AROM WFL - but repetitive use - increase pain    Time  6    Period  Weeks    Status  On-going    Target Date  05/15/19      OT LONG TERM GOAL #2   Title  Pain on PREE improve with more than 25 points    Baseline  pain on PREE at eval 44/50 and decrease to 27/50    Time  6    Period  Weeks    Status  On-going    Target Date  05/15/19      OT LONG TERM GOAL #3   Title  Function score on PREE improve wiht more than 20 points    Baseline  at eval PREE function score 50/50 adn improve to now 34/50    Time  6    Period  Weeks    Status  On-going    Target Date  05/15/19            Plan - 04/03/19 1642    Clinical Impression Statement  Pt is 17 wks out from R ulnar N contusion - but also hindered by Lateral epicondylitis symptoms - pt return to work this past week and pain gradually increase over the 4 days - to about 4/10 - unable to do anything else at home - remind pt to work every day to decrease pain and weekend while she is conditioning arm at work    OT Occupational Profile and History  Problem Focused Assessment - Including review of records relating to presenting problem    Occupational performance deficits (Please refer to evaluation for details):  ADL's;IADL's;Rest and Sleep;Work;Play;Leisure;Social Participation    Body Structure / Function / Physical Skills  ADL;Flexibility;ROM;UE functional use;Edema;Pain;Strength;IADL    Rehab Potential  Good    Clinical Decision Making  Several treatment options, min-mod task modification necessary    Comorbidities Affecting Occupational Performance:  None  Modification or Assistance to Complete Evaluation   No modification of tasks or assist necessary to complete eval    OT Frequency  1x / week    OT Duration  6 weeks    OT Treatment/Interventions  Self-care/ADL training;Iontophoresis;Therapeutic exercise;Splinting;Patient/family education;Contrast Bath;Paraffin;Ultrasound;Manual Therapy    Plan  assess progress and  tolerance the 2 wks at work    [place]  see pt instruction    Consulted and Agree with Plan of Care  Patient       Patient will benefit from skilled therapeutic intervention in order to improve the following deficits and impairments:   Body Structure / Function / Physical Skills: ADL, Flexibility, ROM, UE functional use, Edema, Pain, Strength, IADL       Visit Diagnosis: Muscle weakness (generalized) - Plan: Ot plan of care cert/re-cert  Ulnar nerve compression, right - Plan: Ot plan of care cert/re-cert  Pain in right arm - Plan: Ot plan of care cert/re-cert  Stiffness of right elbow, not elsewhere classified - Plan: Ot plan of care cert/re-cert    Problem List There are no active problems to display for this patient.   Rosalyn Gess OTR/L,CLT 04/03/2019, 4:50 PM  [facility] [address] [place], [address] Phone: [phone]   Fax:  [phone]  Name: Cindy Ramos MRN: [id number] Date of Birth: Nov 23, 1975

## 2019-04-03 NOTE — Patient Instructions (Signed)
Priority is to decrease pain at the end of every day

## 2019-04-15 ENCOUNTER — Encounter: Payer: 59 | Admitting: Occupational Therapy

## 2019-06-15 IMAGING — MG MM BREAST BX W LOC DEV 1ST LESION IMAGE BX SPEC STEREO GUIDE*L*
8 of 11 series · 8 of 19 positions shown · non-contrast
Comparison: Previous exams.

ADDENDUM:
Pathology of the left breast biopsy revealed A. BREAST, LEFT, 12
O'CLOCK; STEREOTACTIC-GUIDED CORE BIOPSY: CALCIFICATIONS ASSOCIATED
WITH APOCRINE MICROCYSTS AND USUAL DUCTAL HYPERPLASIA. NEGATIVE FOR
ATYPIA AND MALIGNANCY.

This was found to be concordant with Dr. Lubana impression and
notes.
Recommendation: Routine bilateral screening mammogram in one year.
At the patient's request, results and recommendations were relayed
to the patient by phone by Lahicov, Haydi Oyuna on 12/26/17. The patient
stated she did well following the biopsy with no bleeding or
bruising. She does have tenderness. Post biopsy instructions were
reviewed with the patient and all of her questions were answered.
She was encouraged to contact the [HOSPITAL] with any
further questions or concerns.
Addendum by Lahicov, Haydi Oyuna on 12/26/17.
CLINICAL DATA: Left breast 12 o'clock calcifications seen on most
recent screening mammography.
EXAM:
LEFT BREAST STEREOTACTIC CORE NEEDLE BIOPSY

[L (1 of 6)]
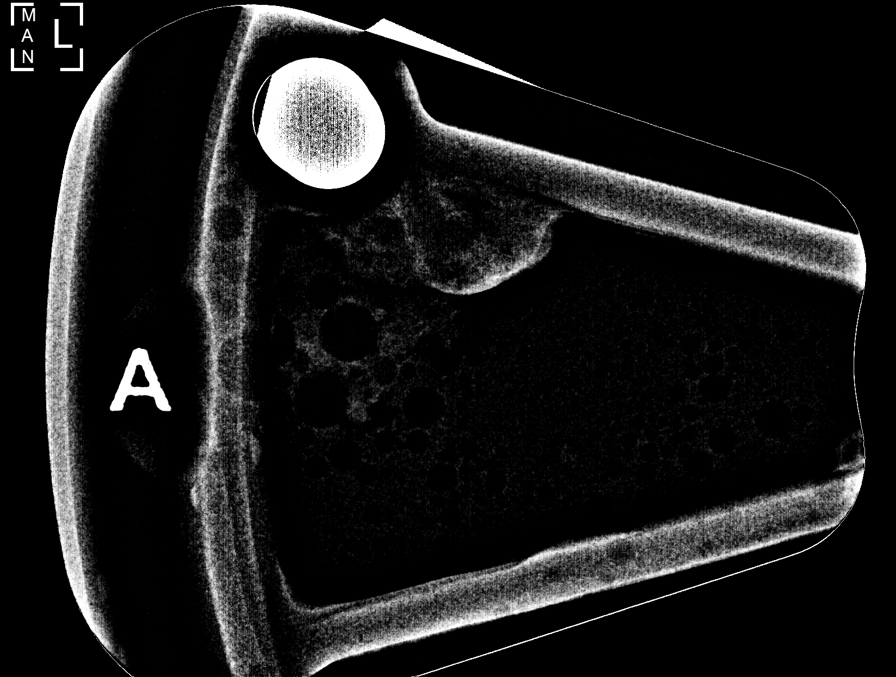

[L (2 of 6)]
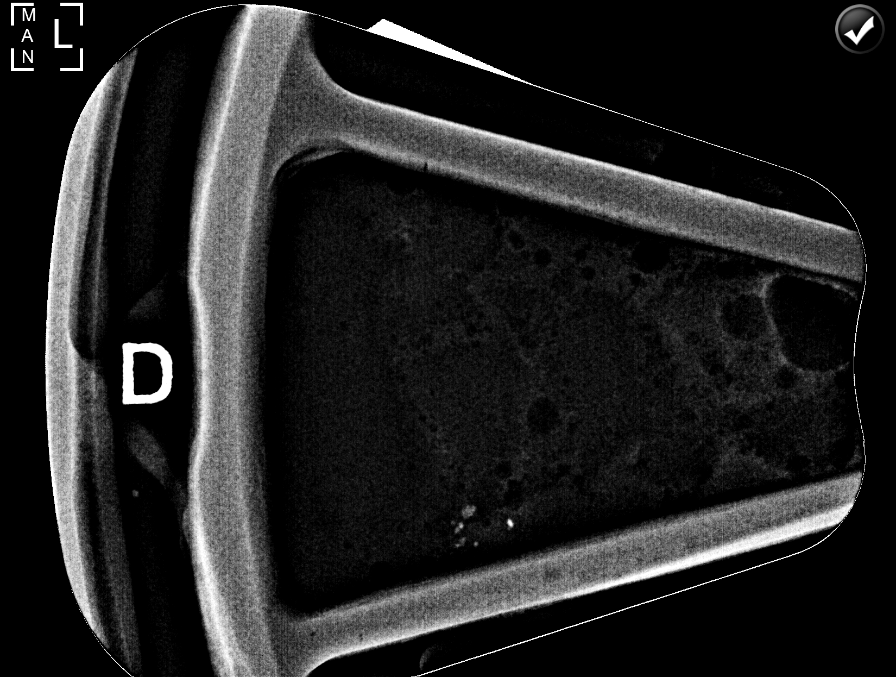

[L (3 of 6)]
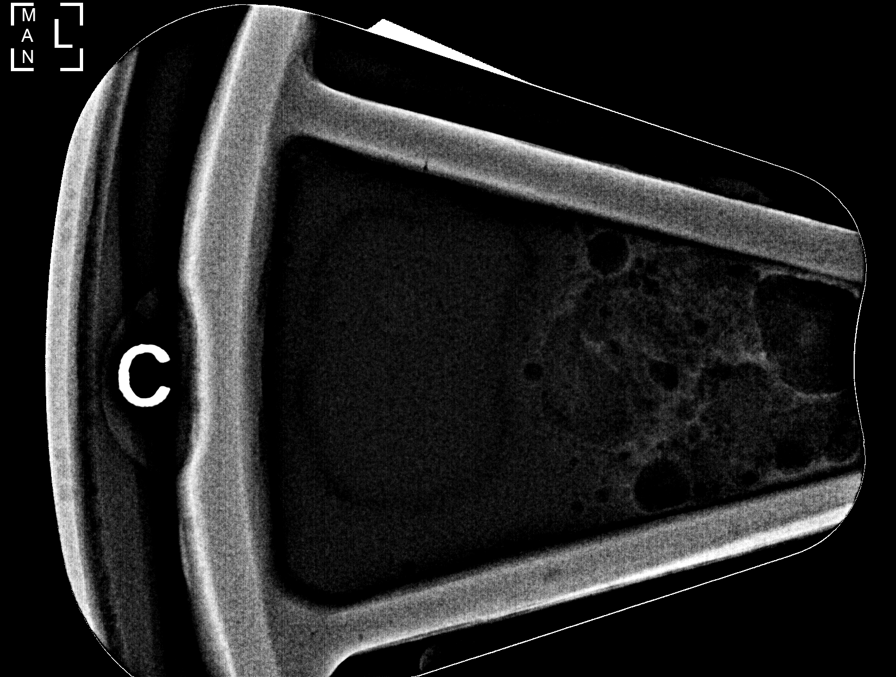

[L (4 of 6)]
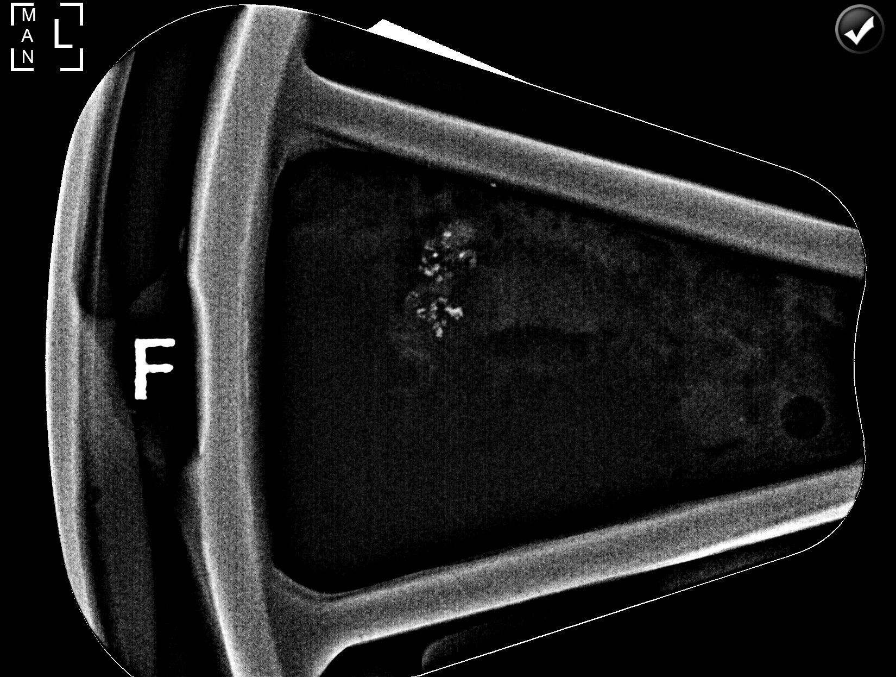

[L (5 of 6)]
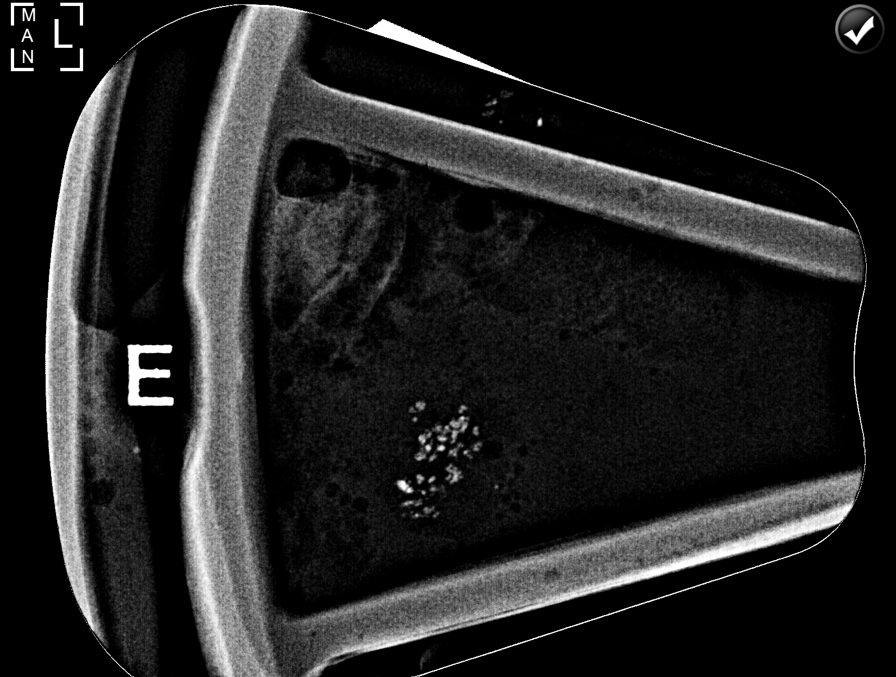

[L (6 of 6)]
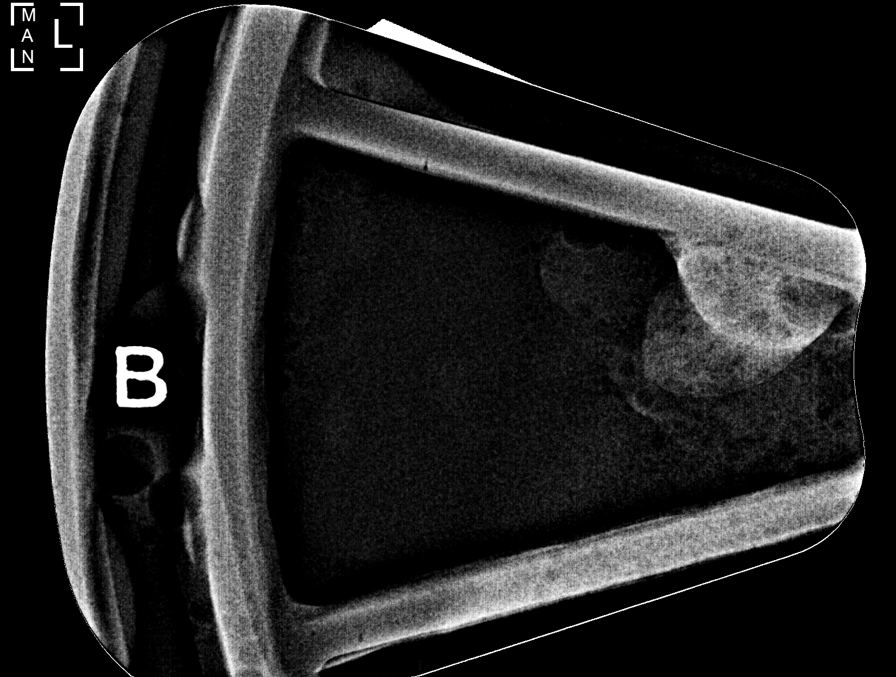

[L CC (1 of 2)]
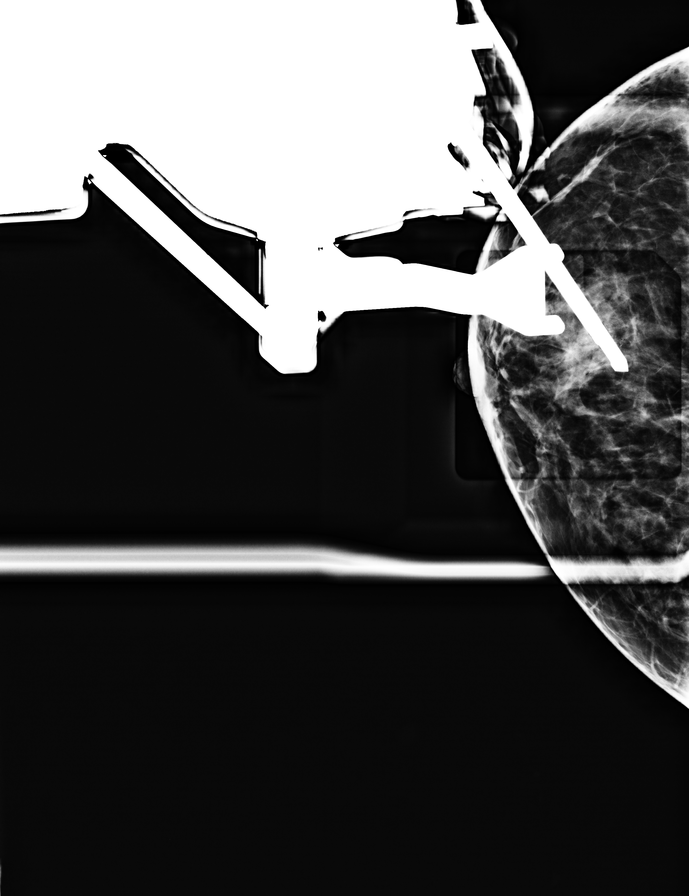

[L CC (2 of 2)]
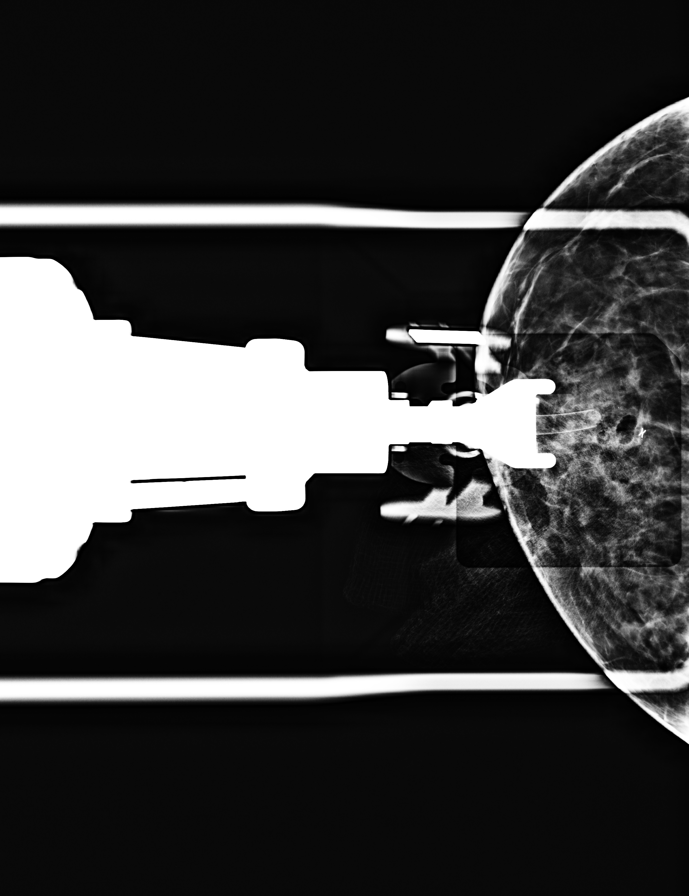

[8 of 19 positions shown; findings below may reference images not displayed]



Using sterile technique and 1% Lidocaine as local anesthetic, under
stereotactic guidance, a 9 gauge vacuum assisted device was used to
perform core needle biopsy of calcifications in the left breast 12
o'clock, anterior depth, using a superior approach. Specimen
radiograph was performed showing presence of calcifications.
Specimens with calcifications are identified for pathology.

Lesion quadrant: Left breast upper inner quadrant

At the conclusion of the procedure, a X shaped tissue marker clip
was deployed into the biopsy cavity. Follow-up 2-view mammogram was
performed and dictated separately.
IMPRESSION: Stereotactic-guided biopsy of left breast. No apparent
complications.

## 2019-06-15 IMAGING — MG MM BREAST LOCALIZATION CLIP
4 series · 4 of 4 positions shown · non-contrast
Comparison: Previous exam(s).

CLINICAL DATA: Post stereotactic core needle biopsy for left breast
calcifications.

EXAM:
DIAGNOSTIC LEFT MAMMOGRAM POST STEREOTACTIC BIOPSY

[L CC (1 of 2)]
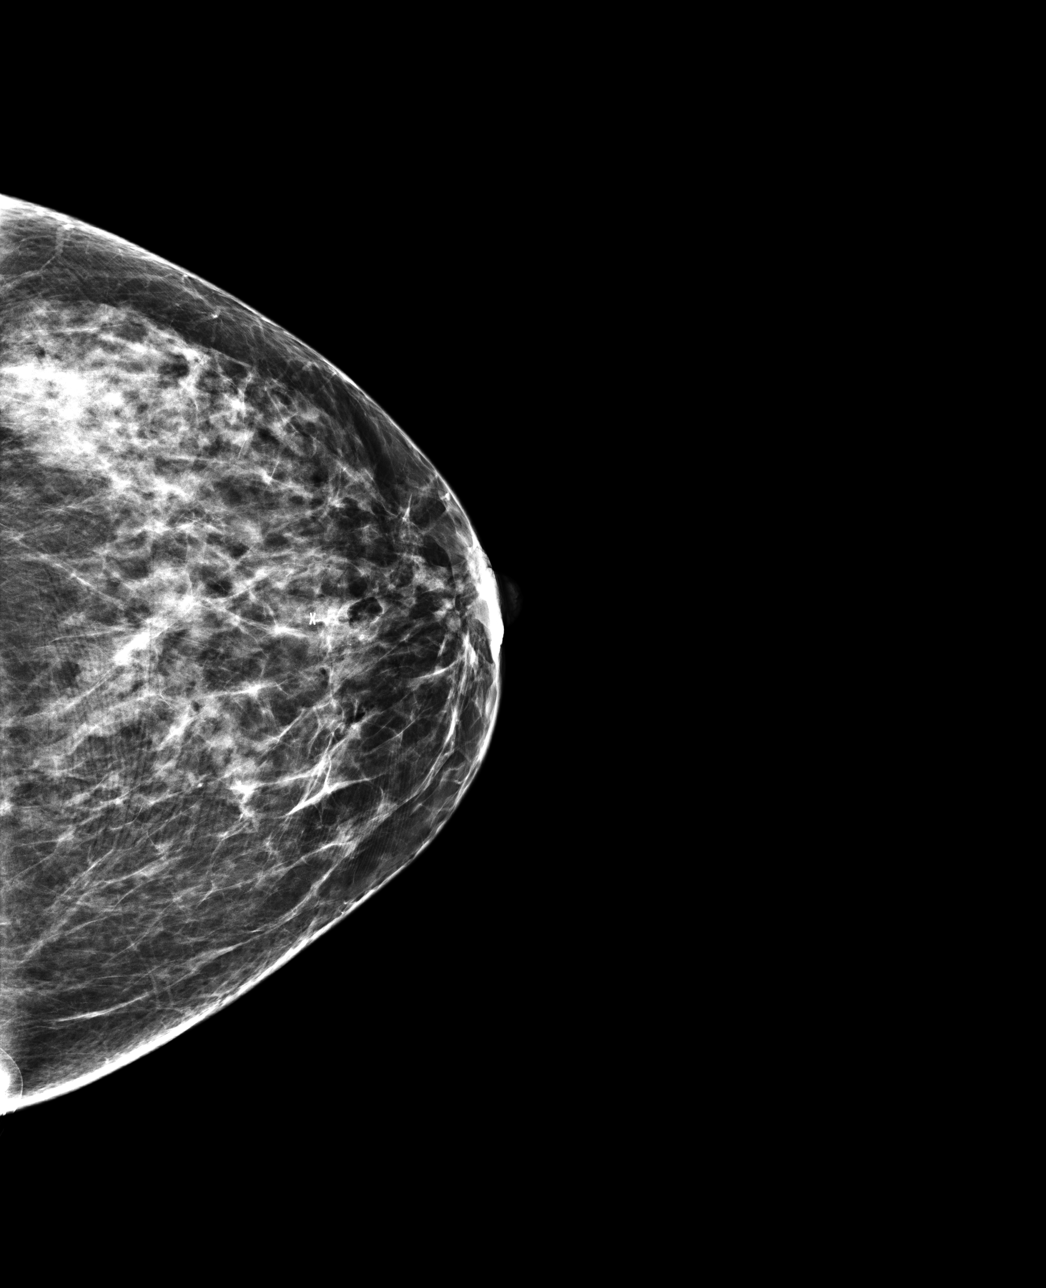

[L ML (1 of 2)]
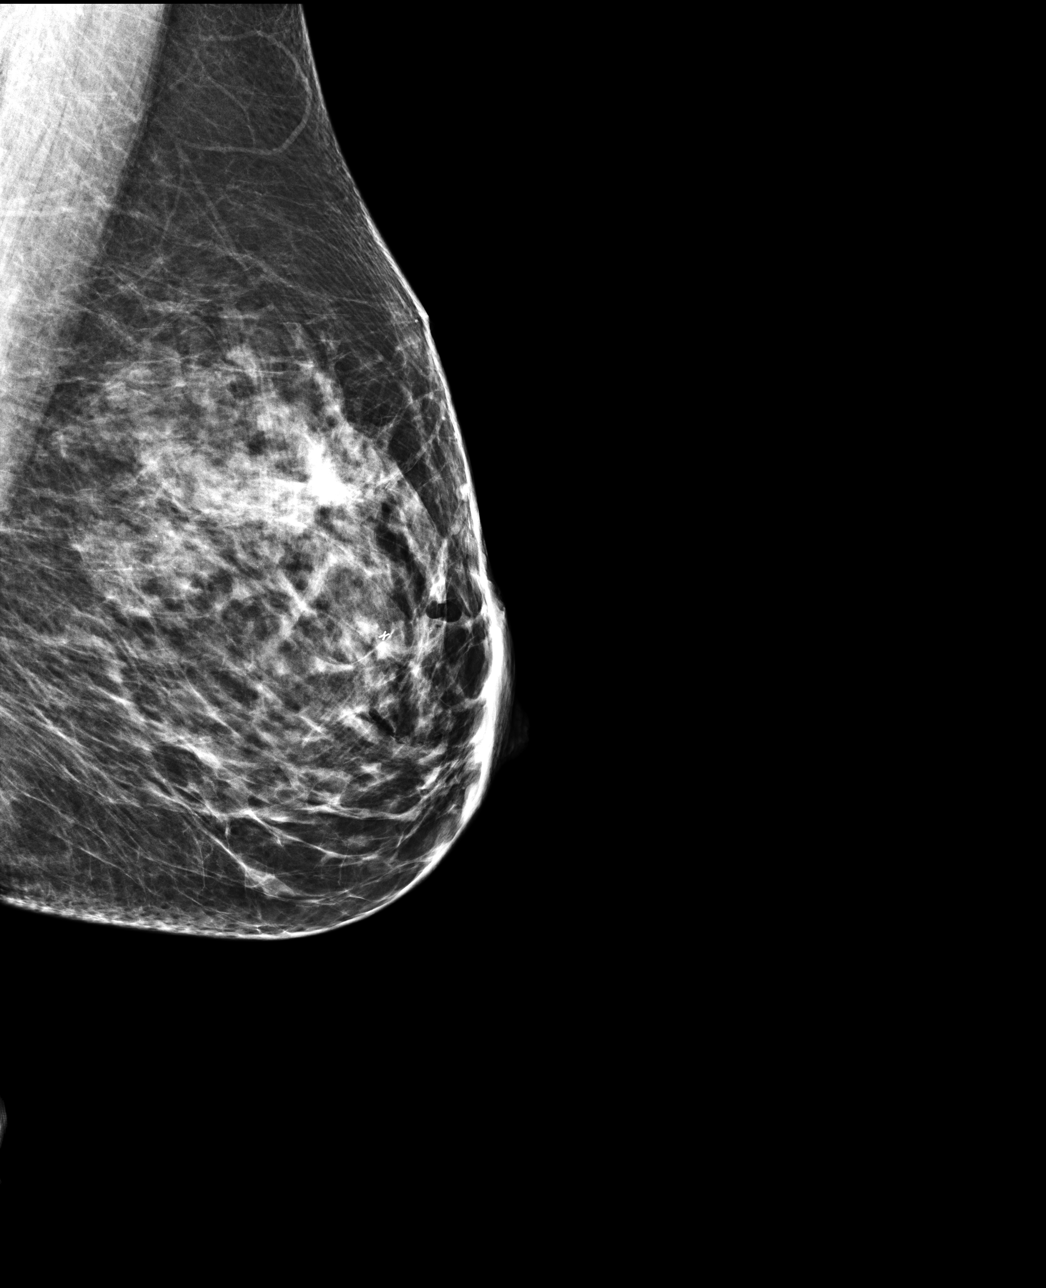

[L ML (2 of 2)]
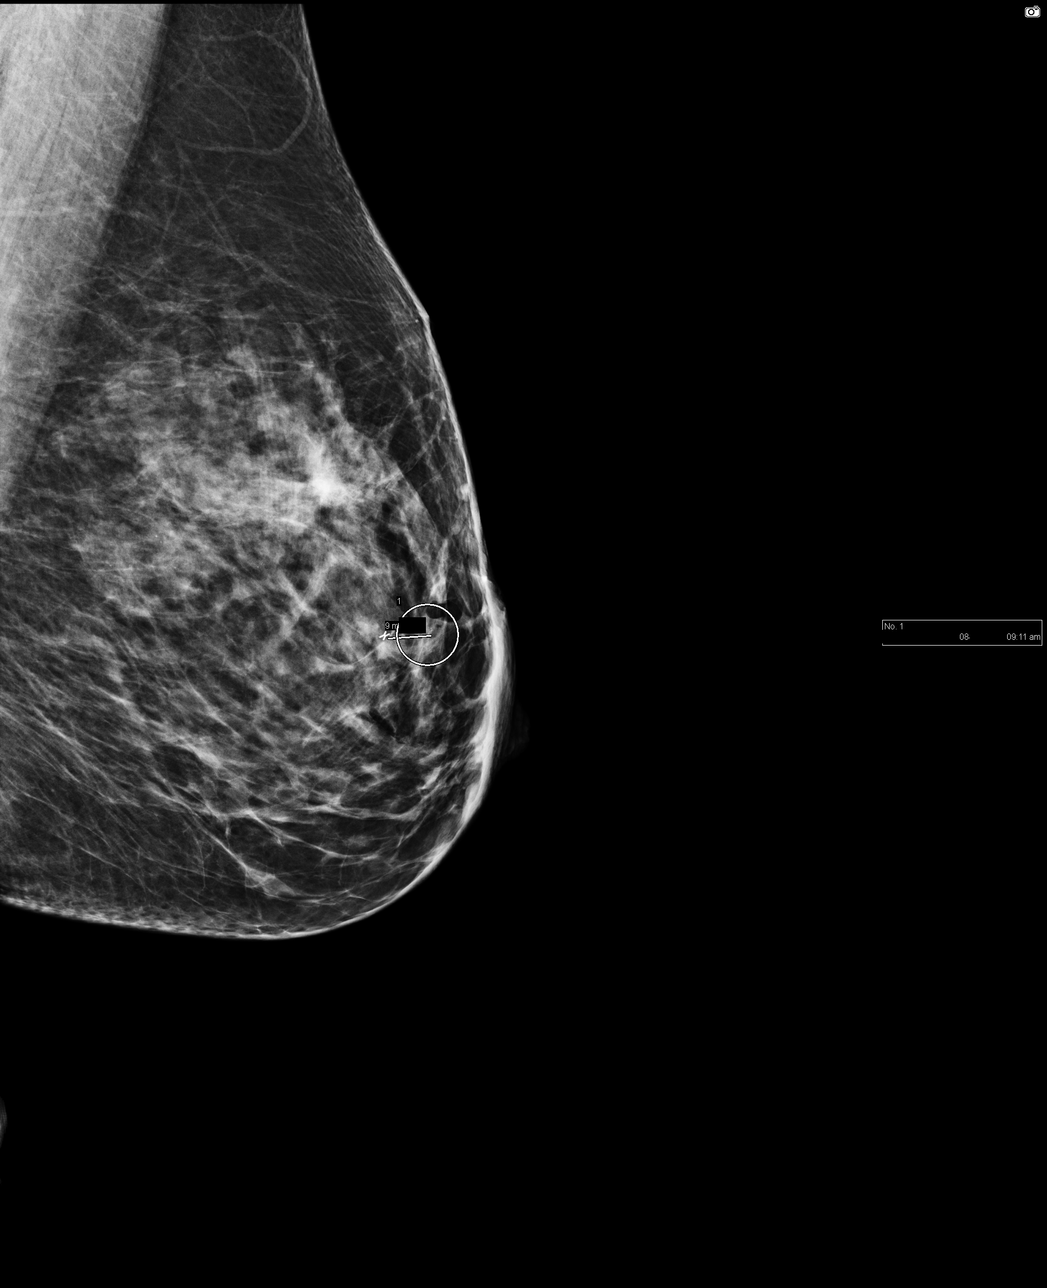

[L CC (2 of 2)]
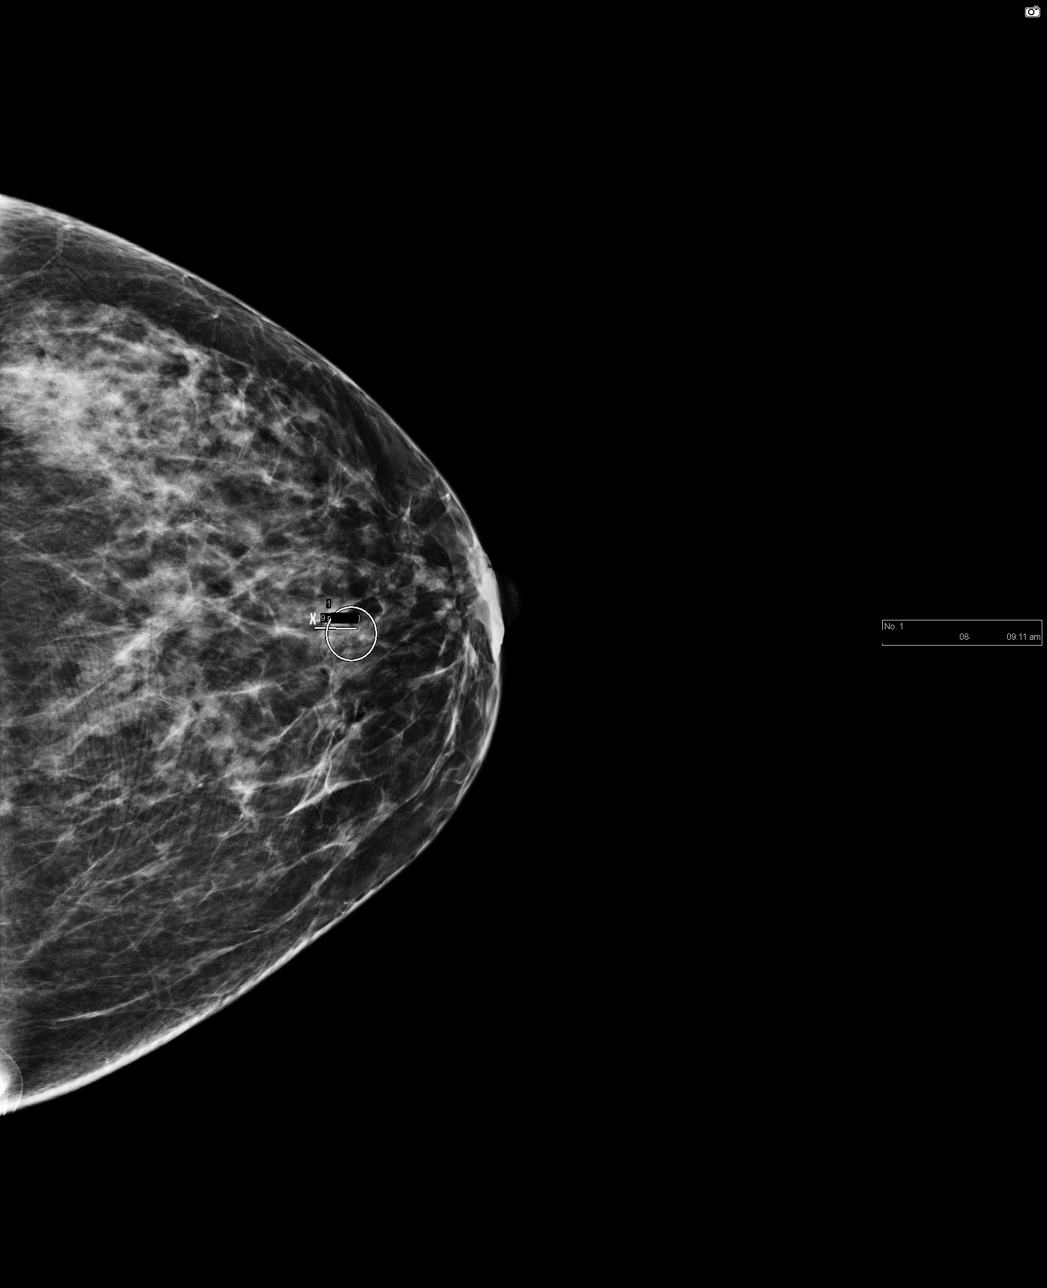

[4 of 4 positions shown; findings below may reference images not displayed]

FINDINGS: Mammographic images were obtained following stereotactically guided
biopsy of left breast calcifications. Two-view mammography
demonstrates presence of X shaped marker, which is located 9 mm
posterior to the residual calcifications. Expected post biopsy
changes are seen.
IMPRESSION: Placement of X shaped marker, post stereotactic core needle biopsy
of the left breast.

The marker is located 9 mm posterior to the residual calcifications,
which is marked on the post biopsy images.

Final Assessment: Post Procedure Mammograms for Marker Placement

## 2019-08-28 ENCOUNTER — Ambulatory Visit: Payer: 59 | Attending: Internal Medicine

## 2019-08-28 DIAGNOSIS — Z20822 Contact with and (suspected) exposure to covid-19: Secondary | ICD-10-CM

## 2019-08-29 LAB — NOVEL CORONAVIRUS, NAA: SARS-CoV-2, NAA: NOT DETECTED

## 2019-08-29 LAB — SARS-COV-2, NAA 2 DAY TAT

## 2021-03-23 ENCOUNTER — Other Ambulatory Visit: Payer: Self-pay | Admitting: Orthopedic Surgery

## 2021-03-23 DIAGNOSIS — M222X2 Patellofemoral disorders, left knee: Secondary | ICD-10-CM

## 2021-03-23 DIAGNOSIS — M2392 Unspecified internal derangement of left knee: Secondary | ICD-10-CM

## 2021-03-23 DIAGNOSIS — S8392XA Sprain of unspecified site of left knee, initial encounter: Secondary | ICD-10-CM

## 2021-11-23 ENCOUNTER — Other Ambulatory Visit: Payer: Self-pay | Admitting: Obstetrics and Gynecology

## 2021-11-23 DIAGNOSIS — Z1231 Encounter for screening mammogram for malignant neoplasm of breast: Secondary | ICD-10-CM

## 2021-12-15 ENCOUNTER — Ambulatory Visit
Admission: RE | Admit: 2021-12-15 | Discharge: 2021-12-15 | Disposition: A | Discharge status: Home / Self Care | Payer: BC Managed Care – PPO | Source: Ambulatory Visit | Attending: Obstetrics and Gynecology | Admitting: Obstetrics and Gynecology

## 2021-12-15 DIAGNOSIS — Z1231 Encounter for screening mammogram for malignant neoplasm of breast: Secondary | ICD-10-CM | POA: Diagnosis present

## 2022-11-09 ENCOUNTER — Emergency Department
Admission: EM | Admit: 2022-11-09 | Discharge: 2022-11-09 | Disposition: A | Discharge status: Home / Self Care | Payer: BC Managed Care – PPO | Attending: Emergency Medicine | Admitting: Emergency Medicine

## 2022-11-09 ENCOUNTER — Other Ambulatory Visit: Payer: Self-pay

## 2022-11-09 ENCOUNTER — Emergency Department: Payer: BC Managed Care – PPO

## 2022-11-09 DIAGNOSIS — R1031 Right lower quadrant pain: Secondary | ICD-10-CM | POA: Diagnosis present

## 2022-11-09 DIAGNOSIS — R109 Unspecified abdominal pain: Secondary | ICD-10-CM

## 2022-11-09 DIAGNOSIS — N281 Cyst of kidney, acquired: Secondary | ICD-10-CM | POA: Diagnosis not present

## 2022-11-09 LAB — CBC WITH DIFFERENTIAL/PLATELET
Abs Immature Granulocytes: 0.02 10*3/uL (ref 0.00–0.07)
Basophils Absolute: 0 10*3/uL (ref 0.0–0.1)
Basophils Relative: 1 %
Eosinophils Absolute: 0.4 10*3/uL (ref 0.0–0.5)
Eosinophils Relative: 6 %
HCT: 39.3 % (ref 36.0–46.0)
Hemoglobin: 13.2 g/dL (ref 12.0–15.0)
Immature Granulocytes: 0 %
Lymphocytes Relative: 33 %
Lymphs Abs: 2.1 10*3/uL (ref 0.7–4.0)
MCH: 30.3 pg (ref 26.0–34.0)
MCHC: 33.6 g/dL (ref 30.0–36.0)
MCV: 90.3 fL (ref 80.0–100.0)
Monocytes Absolute: 0.4 10*3/uL (ref 0.1–1.0)
Monocytes Relative: 6 %
Neutro Abs: 3.5 10*3/uL (ref 1.7–7.7)
Neutrophils Relative %: 54 %
Platelets: 279 10*3/uL (ref 150–400)
RBC: 4.35 MIL/uL (ref 3.87–5.11)
RDW: 12.2 % (ref 11.5–15.5)
WBC: 6.6 10*3/uL (ref 4.0–10.5)
nRBC: 0 % (ref 0.0–0.2)

## 2022-11-09 LAB — COMPREHENSIVE METABOLIC PANEL
ALT: 22 U/L (ref 0–44)
AST: 19 U/L (ref 15–41)
Albumin: 4.6 g/dL (ref 3.5–5.0)
Alkaline Phosphatase: 57 U/L (ref 38–126)
Anion gap: 9 (ref 5–15)
BUN: 13 mg/dL (ref 6–20)
CO2: 27 mmol/L (ref 22–32)
Calcium: 9.2 mg/dL (ref 8.9–10.3)
Chloride: 104 mmol/L (ref 98–111)
Creatinine, Ser: 0.78 mg/dL (ref 0.44–1.00)
GFR, Estimated: >60 mL/min (ref >60)
Glucose, Bld: 98 mg/dL (ref 70–99)
Potassium: 3.9 mmol/L (ref 3.5–5.1)
Sodium: 140 mmol/L (ref 135–145)
Total Bilirubin: 0.7 mg/dL (ref 0.3–1.2)
Total Protein: 7.6 g/dL (ref 6.5–8.1)

## 2022-11-09 LAB — URINALYSIS, W/ REFLEX TO CULTURE (INFECTION SUSPECTED)
Bilirubin Urine: NEGATIVE
Glucose, UA: NEGATIVE mg/dL
Hgb urine dipstick: NEGATIVE
Ketones, ur: NEGATIVE mg/dL
Leukocytes,Ua: NEGATIVE
Nitrite: NEGATIVE
Protein, ur: NEGATIVE mg/dL
Specific Gravity, Urine: 1.004 — ABNORMAL LOW (ref 1.005–1.030)
pH: 5 (ref 5.0–8.0)

## 2022-11-09 LAB — PREGNANCY, URINE: Preg Test, Ur: NEGATIVE

## 2022-11-09 LAB — LIPASE, BLOOD: Lipase: 37 U/L (ref 11–51)

## 2022-11-09 MED ORDER — IOHEXOL 300 MG/ML  SOLN
100.0000 mL | Freq: Once | INTRAMUSCULAR | Status: AC | PRN
  Administered 2022-11-09: 100 mL via INTRAVENOUS

## 2022-11-09 MED ORDER — ONDANSETRON HCL 4 MG/2ML IJ SOLN
4.0000 mg | Freq: Once | INTRAMUSCULAR | Status: AC
  Administered 2022-11-09: 4 mg via INTRAVENOUS
  Filled 2022-11-09: qty 2

## 2022-11-09 MED ORDER — MORPHINE SULFATE (PF) 4 MG/ML IV SOLN
4.0000 mg | Freq: Once | INTRAVENOUS | Status: AC
  Administered 2022-11-09: 4 mg via INTRAVENOUS
  Filled 2022-11-09: qty 1

## 2022-11-09 MED ORDER — SODIUM CHLORIDE 0.9 % IV BOLUS
1000.0000 mL | Freq: Once | INTRAVENOUS | Status: AC
  Administered 2022-11-09: 1000 mL via INTRAVENOUS

## 2022-11-09 MED ORDER — ONDANSETRON HCL 4 MG PO TABS: 4.0000 mg | ORAL_TABLET | Freq: Every day | ORAL | 1 refills | Status: AC | PRN

## 2022-11-09 MED ORDER — OXYCODONE HCL 5 MG PO TABS: 5.0000 mg | ORAL_TABLET | Freq: Three times a day (TID) | ORAL | 0 refills | Status: AC | PRN

## 2022-11-09 MED ORDER — KETOROLAC TROMETHAMINE 15 MG/ML IJ SOLN
15.0000 mg | Freq: Once | INTRAMUSCULAR | Status: AC
  Administered 2022-11-09: 15 mg via INTRAVENOUS
  Filled 2022-11-09: qty 1

## 2022-11-09 NOTE — ED Notes (Signed)
First Nurse Note: Patient sent to ED from [facility] for RLQ abd and back pain x3 days with diarrhea.

## 2022-11-09 NOTE — Discharge Instructions (Signed)
Take acetaminophen 650 mg and ibuprofen 400 mg every 6 hours for pain.  Take with food. Oxycodone as needed for severe/breakthrough pain as we discussed.  Take and for nausea as needed.  Call your primary doctor for a follow-up appointment to recheck on your symptoms and discuss further evaluation of the kidney cyst as needed.  Today's emergency department evaluation showed no signs of emergencies like appendicitis, other infections, blockages, or problems that would require hospitalization or surgeries at this time.  Is important to keep a close eye on your symptoms and call your doctor right away if you develop any new, worsening, or unexpected symptoms.

## 2022-11-09 NOTE — ED Provider Notes (Signed)
[facility] Provider Note    Event Date/Time   First MD Initiated Contact with Patient 11/09/22 1423     (approximate)   History   Back Pain   HPI  CHEREESE CILENTO is a 47 y.o. female   Past medical history of no significant past medical history presents emergency department with right flank pain for the last 3 to 4 days now radiating down to the right lower quadrant.  Nausea but no vomiting.  Subjective fever, no urinary symptoms, no vaginal discharge.  She is postmenopausal.  History of gallbladder removal.  She has no history of kidney stones.  Denies chest pain, shortness of breath or respiratory infectious symptoms.   External Medical Documents Reviewed: Urgent care note from earlier today with right flank pain asked to come to the emergency department for imaging. Urinalysis from urgent care today shows no signs of infection or red blood cells     Physical Exam   Triage Vital Signs: ED Triage Vitals  Enc Vitals Group     BP 11/09/22 1153 (!) 151/85     Pulse Rate 11/09/22 1153 81     Resp 11/09/22 1153 14     Temp 11/09/22 1153 98.2 F (36.8 C)     Temp src --      SpO2 11/09/22 1153 98 %     Weight 11/09/22 1152 210 lb (95.3 kg)     Height 11/09/22 1152 5\' 3"  (1.6 m)     Head Circumference --      Peak Flow --      Pain Score 11/09/22 1152 9     Pain Loc --      Pain Edu? --      Excl. in GC? --     Most recent vital signs: Vitals:   11/09/22 1153 11/09/22 1629  BP: (!) 151/85 (!) 144/86  Pulse: 81 82  Resp: 14 14  Temp: 98.2 F (36.8 C) 98.3 F (36.8 C)  SpO2: 98% 98%    General: Awake CV:  Good peripheral perfusion.  Resp:  Normal effort.  Abd:  No distention.  Other:  She appears to be in pain, writhing in stretcher holding onto the right side.  She has right lower quadrant tenderness to palpation without rigidity rebound or guarding.   ED Results / Procedures / Treatments   Labs (all labs ordered are  listed, but only abnormal results are displayed) Labs Reviewed  URINALYSIS, W/ REFLEX TO CULTURE (INFECTION SUSPECTED) - Abnormal; Notable for the following components:      Result Value   Color, Urine STRAW (*)    APPearance CLEAR (*)    Specific Gravity, Urine 1.004 (*)    Bacteria, UA RARE (*)    All other components within normal limits  PREGNANCY, URINE  COMPREHENSIVE METABOLIC PANEL  LIPASE, BLOOD  CBC WITH DIFFERENTIAL/PLATELET     I ordered and reviewed the above labs they are notable for negative pregnancy test   RADIOLOGY I independently reviewed and interpreted CT of the abdomen pelvis see no obvious obstructive or inflammatory changes, there is a cyst in the right kidney   PROCEDURES:  Critical Care performed: No  Procedures   MEDICATIONS ORDERED IN ED: Medications  ondansetron (ZOFRAN) injection 4 mg (4 mg Intravenous Given 11/09/22 1515)  sodium chloride 0.9 % bolus 1,000 mL (1,000 mLs Intravenous New Bag/Given 11/09/22 1515)  morphine (PF) 4 MG/ML injection 4 mg (4 mg Intravenous Given 11/09/22 1517)  ketorolac (  TORADOL) 15 MG/ML injection 15 mg (15 mg Intravenous Given 11/09/22 1516)  iohexol (OMNIPAQUE) 300 MG/ML solution 100 mL (100 mLs Intravenous Contrast Given 11/09/22 1551)     IMPRESSION / MDM / ASSESSMENT AND PLAN / ED COURSE  I reviewed the triage vital signs and the nursing notes.                                Patient's presentation is most consistent with acute presentation with potential threat to life or bodily function.  Differential diagnosis includes, but is not limited to, pyelonephritis, renal colic, kidney stone, appendicitis, retained stone/choledocholithiasis, TOA, ovarian pathology like torsion   The patient is on the cardiac monitor to evaluate for evidence of arrhythmia and/or significant heart rate changes.  MDM: Right flank pain right lower quadrant pain concerning for intra-abdominal infection like appendicitis, kidney  stone, urinary tract infection.  Urinalysis from earlier today look normal.  Will get a CAT scan abdomen pelvis rule out surgical abdominal pathologies, get placed blood work, urinalysis, treat with IV morphine, Toradol, fluids, antiemetic.   Pain moderately controlled with medications above.  Workup unremarkable for acute emergent pathologies including CT scan, urinalysis and basic labs.  There is an incidental right kidney cyst, patient states that she has been told she has a cyst in the past.  Given stability in the emergency department unremarkable workup as above, pain controlled, plan will be for discharge and outpatient follow-up and she knows to return with any new, worsening or unexpected symptoms.        FINAL CLINICAL IMPRESSION(S) / ED DIAGNOSES   Final diagnoses:  Right flank pain  Right lower quadrant pain  Renal cyst     Rx / DC Orders   ED Discharge Orders          Ordered    oxyCODONE (ROXICODONE) 5 MG immediate release tablet  Every 8 hours PRN        11/09/22 1637    ondansetron (ZOFRAN) 4 MG tablet  Daily PRN        11/09/22 1637    Ambulatory Referral to Primary Care (Establish Care)        11/09/22 1637             Note:  This document was prepared using Dragon voice recognition software and may include unintentional dictation errors.    Pilar Jarvis, MD 11/09/22 [phone]

## 2022-11-09 NOTE — ED Triage Notes (Signed)
Pt here with back pain since Monday night. Pt states her pain is lower and on the right side. Pt denies fall or injury.

## 2022-11-09 NOTE — ED Notes (Signed)
See triage notes. Patient in for lower back pain since Monday

## 2023-03-13 ENCOUNTER — Other Ambulatory Visit: Payer: Self-pay | Admitting: Internal Medicine

## 2023-03-13 DIAGNOSIS — Z1231 Encounter for screening mammogram for malignant neoplasm of breast: Secondary | ICD-10-CM

## 2024-02-21 ENCOUNTER — Ambulatory Visit
Admission: RE | Admit: 2024-02-21 | Discharge: 2024-02-21 | Disposition: A | Discharge status: Home / Self Care | Source: Ambulatory Visit | Attending: Internal Medicine | Admitting: Internal Medicine

## 2024-02-21 DIAGNOSIS — Z1231 Encounter for screening mammogram for malignant neoplasm of breast: Secondary | ICD-10-CM | POA: Diagnosis present
